# Patient Record
Sex: Female | Born: 1980 | Race: Black or African American | Hispanic: No | Marital: Single | Smoking: Former smoker
Health system: Southern US, Community
[De-identification: ages and names within clinical notes are randomized; demographics above are authoritative.]

## PROBLEM LIST (undated history)

## (undated) ENCOUNTER — Inpatient Hospital Stay (HOSPITAL_COMMUNITY): Payer: Self-pay

## (undated) DIAGNOSIS — T7840XA Allergy, unspecified, initial encounter: Secondary | ICD-10-CM

## (undated) DIAGNOSIS — F329 Major depressive disorder, single episode, unspecified: Secondary | ICD-10-CM

## (undated) DIAGNOSIS — Z8619 Personal history of other infectious and parasitic diseases: Secondary | ICD-10-CM

## (undated) DIAGNOSIS — Z87891 Personal history of nicotine dependence: Secondary | ICD-10-CM

## (undated) DIAGNOSIS — R519 Headache, unspecified: Secondary | ICD-10-CM

## (undated) DIAGNOSIS — F32A Depression, unspecified: Secondary | ICD-10-CM

## (undated) DIAGNOSIS — G4730 Sleep apnea, unspecified: Secondary | ICD-10-CM

## (undated) DIAGNOSIS — R51 Headache: Secondary | ICD-10-CM

## (undated) DIAGNOSIS — G43909 Migraine, unspecified, not intractable, without status migrainosus: Secondary | ICD-10-CM

## (undated) DIAGNOSIS — K219 Gastro-esophageal reflux disease without esophagitis: Secondary | ICD-10-CM

## (undated) DIAGNOSIS — Z9889 Other specified postprocedural states: Secondary | ICD-10-CM

## (undated) DIAGNOSIS — J939 Pneumothorax, unspecified: Secondary | ICD-10-CM

## (undated) DIAGNOSIS — F419 Anxiety disorder, unspecified: Secondary | ICD-10-CM

## (undated) HISTORY — DX: Migraine, unspecified, not intractable, without status migrainosus: G43.909

## (undated) HISTORY — PX: OTHER SURGICAL HISTORY: SHX169

## (undated) HISTORY — DX: Personal history of nicotine dependence: Z87.891

## (undated) HISTORY — PX: PLEURAL SCARIFICATION: SHX748

## (undated) HISTORY — DX: Headache, unspecified: R51.9

## (undated) HISTORY — DX: Headache: R51

## (undated) HISTORY — DX: Personal history of other infectious and parasitic diseases: Z86.19

## (undated) HISTORY — DX: Gastro-esophageal reflux disease without esophagitis: K21.9

## (undated) HISTORY — DX: Depression, unspecified: F32.A

## (undated) HISTORY — DX: Major depressive disorder, single episode, unspecified: F32.9

## (undated) HISTORY — DX: Allergy, unspecified, initial encounter: T78.40XA

## (undated) HISTORY — PX: COLPOSCOPY VULVA W/ BIOPSY: SUR282

## (undated) HISTORY — DX: Pneumothorax, unspecified: J93.9

## (undated) HISTORY — DX: Other specified postprocedural states: Z98.890

---
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
## 2002-01-31 ENCOUNTER — Emergency Department (HOSPITAL_COMMUNITY): Admission: EM | Admit: 2002-01-31 | Discharge: 2002-01-31 | Payer: Self-pay | Admitting: Emergency Medicine

## 2002-01-31 ENCOUNTER — Encounter: Payer: Self-pay | Admitting: Emergency Medicine

## 2003-06-20 ENCOUNTER — Emergency Department (HOSPITAL_COMMUNITY): Admission: AD | Admit: 2003-06-20 | Discharge: 2003-06-20 | Payer: Self-pay | Admitting: Family Medicine

## 2003-06-23 HISTORY — PX: BREAST BIOPSY: SHX20

## 2003-08-14 ENCOUNTER — Ambulatory Visit (HOSPITAL_COMMUNITY): Admission: RE | Admit: 2003-08-14 | Discharge: 2003-08-14 | Payer: Self-pay | Admitting: General Surgery

## 2003-08-14 ENCOUNTER — Encounter (INDEPENDENT_AMBULATORY_CARE_PROVIDER_SITE_OTHER): Payer: Self-pay | Admitting: *Deleted

## 2003-08-14 ENCOUNTER — Ambulatory Visit (HOSPITAL_BASED_OUTPATIENT_CLINIC_OR_DEPARTMENT_OTHER): Admission: RE | Admit: 2003-08-14 | Discharge: 2003-08-14 | Payer: Self-pay | Admitting: General Surgery

## 2003-10-16 ENCOUNTER — Emergency Department (HOSPITAL_COMMUNITY): Admission: EM | Admit: 2003-10-16 | Discharge: 2003-10-16 | Payer: Self-pay | Admitting: Family Medicine

## 2004-05-06 ENCOUNTER — Emergency Department (HOSPITAL_COMMUNITY): Admission: EM | Admit: 2004-05-06 | Discharge: 2004-05-06 | Payer: Self-pay | Admitting: Emergency Medicine

## 2005-03-20 ENCOUNTER — Emergency Department (HOSPITAL_COMMUNITY): Admission: EM | Admit: 2005-03-20 | Discharge: 2005-03-20 | Payer: Self-pay | Admitting: Emergency Medicine

## 2006-07-11 ENCOUNTER — Emergency Department (HOSPITAL_COMMUNITY): Admission: EM | Admit: 2006-07-11 | Discharge: 2006-07-11 | Payer: Self-pay | Admitting: Emergency Medicine

## 2006-07-19 ENCOUNTER — Other Ambulatory Visit: Admission: RE | Admit: 2006-07-19 | Discharge: 2006-07-19 | Payer: Self-pay | Admitting: Obstetrics and Gynecology

## 2006-11-02 ENCOUNTER — Other Ambulatory Visit: Admission: RE | Admit: 2006-11-02 | Discharge: 2006-11-02 | Payer: Self-pay | Admitting: Obstetrics and Gynecology

## 2007-02-01 ENCOUNTER — Other Ambulatory Visit: Admission: RE | Admit: 2007-02-01 | Discharge: 2007-02-01 | Payer: Self-pay | Admitting: Obstetrics and Gynecology

## 2007-03-15 ENCOUNTER — Emergency Department (HOSPITAL_COMMUNITY): Admission: EM | Admit: 2007-03-15 | Discharge: 2007-03-15 | Payer: Self-pay | Admitting: Emergency Medicine

## 2007-05-03 ENCOUNTER — Other Ambulatory Visit: Admission: RE | Admit: 2007-05-03 | Discharge: 2007-05-03 | Payer: Self-pay | Admitting: Obstetrics and Gynecology

## 2007-05-29 ENCOUNTER — Emergency Department (HOSPITAL_COMMUNITY): Admission: EM | Admit: 2007-05-29 | Discharge: 2007-05-29 | Payer: Self-pay | Admitting: Emergency Medicine

## 2007-06-08 ENCOUNTER — Emergency Department (HOSPITAL_COMMUNITY): Admission: EM | Admit: 2007-06-08 | Discharge: 2007-06-08 | Payer: Self-pay | Admitting: Emergency Medicine

## 2007-09-18 ENCOUNTER — Emergency Department (HOSPITAL_COMMUNITY): Admission: EM | Admit: 2007-09-18 | Discharge: 2007-09-18 | Payer: Self-pay | Admitting: Emergency Medicine

## 2008-01-21 ENCOUNTER — Inpatient Hospital Stay (HOSPITAL_COMMUNITY): Admission: AD | Admit: 2008-01-21 | Discharge: 2008-01-21 | Payer: Self-pay | Admitting: Obstetrics and Gynecology

## 2008-02-09 ENCOUNTER — Inpatient Hospital Stay (HOSPITAL_COMMUNITY): Admission: AD | Admit: 2008-02-09 | Discharge: 2008-02-09 | Payer: Self-pay | Admitting: Obstetrics and Gynecology

## 2008-03-27 ENCOUNTER — Inpatient Hospital Stay (HOSPITAL_COMMUNITY): Admission: AD | Admit: 2008-03-27 | Discharge: 2008-03-28 | Payer: Self-pay | Admitting: Obstetrics & Gynecology

## 2008-04-04 ENCOUNTER — Inpatient Hospital Stay (HOSPITAL_COMMUNITY): Admission: AD | Admit: 2008-04-04 | Discharge: 2008-04-05 | Payer: Self-pay | Admitting: Obstetrics and Gynecology

## 2008-04-21 ENCOUNTER — Inpatient Hospital Stay (HOSPITAL_COMMUNITY): Admission: AD | Admit: 2008-04-21 | Discharge: 2008-04-21 | Payer: Self-pay | Admitting: Obstetrics and Gynecology

## 2008-05-11 ENCOUNTER — Inpatient Hospital Stay (HOSPITAL_COMMUNITY): Admission: AD | Admit: 2008-05-11 | Discharge: 2008-05-14 | Payer: Self-pay | Admitting: Obstetrics & Gynecology

## 2008-08-08 ENCOUNTER — Emergency Department (HOSPITAL_COMMUNITY): Admission: EM | Admit: 2008-08-08 | Discharge: 2008-08-08 | Payer: Self-pay | Admitting: Family Medicine

## 2010-08-06 ENCOUNTER — Other Ambulatory Visit (HOSPITAL_COMMUNITY): Payer: Self-pay | Admitting: Family Medicine

## 2010-08-06 DIAGNOSIS — N8300 Follicular cyst of ovary, unspecified side: Secondary | ICD-10-CM

## 2010-08-14 ENCOUNTER — Ambulatory Visit (HOSPITAL_COMMUNITY)
Admission: RE | Admit: 2010-08-14 | Discharge: 2010-08-14 | Disposition: A | Discharge status: Home / Self Care | Payer: Medicaid Other | Source: Ambulatory Visit | Attending: Family Medicine | Admitting: Family Medicine

## 2010-08-14 DIAGNOSIS — N8300 Follicular cyst of ovary, unspecified side: Secondary | ICD-10-CM | POA: Insufficient documentation

## 2010-11-07 NOTE — Op Note (Signed)
NAME:  OLAR, SANTINI                         ACCOUNT NO.:  [id number]   MEDICAL RECORD NO.:  [id number]                   PATIENT TYPE:  AMB   LOCATION:  DSC                                  FACILITY:  [facility]   PHYSICIAN:  Rose Phi. Maple Hudson, M.D.                DATE OF BIRTH:  Mar 20, 1981   DATE OF PROCEDURE:  08/14/2003  DATE OF DISCHARGE:                                 OPERATIVE REPORT   PREOPERATIVE DIAGNOSIS:  Fibroadenoma of the left breast.   POSTOPERATIVE DIAGNOSIS:  Fibroadenoma of the left breast.   OPERATION PERFORMED:  Excision of fibroadenoma of the left breast.   SURGEON:  Rose Phi. Maple Hudson, M.D.   ANESTHESIA:  MAC.   DESCRIPTION OF PROCEDURE:  The patient was placed on the operating table  with the arms extended on the arm board.  The left breast was prepped and  draped in the usual fashion.  The palpable 2 cm fibroadenoma was in the  upper outer quadrant.  I made a small drawing over the palpable mass to  outline the incision.  The area was then infiltrated with a mixture of 1%  Xylocaine and 0.25% Marcaine.  Incision was made and the fibroadenoma  exposed.  I put a suture in it to lift up for traction purposes and then  simply excised it.  Hemostasis was obtained with the cautery.  Subcuticular  closure with 4-0 Monocryl and Steri-Strips carried out.  Dressing applied.  The patient was then transferred to the recovery room in satisfactory  condition having tolerated the procedure well.                                               Rose Phi. Maple Hudson, M.D.    PRY/MEDQ  D:  08/14/2003  T:  08/14/2003  Job:  [id number]

## 2011-01-20 ENCOUNTER — Emergency Department (HOSPITAL_COMMUNITY): Payer: Medicaid Other

## 2011-01-20 ENCOUNTER — Inpatient Hospital Stay (HOSPITAL_COMMUNITY)
Admission: EM | Admit: 2011-01-20 | Discharge: 2011-01-25 | DRG: 201 | Disposition: A | Discharge status: Home / Self Care | Payer: Medicaid Other | Attending: Thoracic Surgery | Admitting: Thoracic Surgery

## 2011-01-20 DIAGNOSIS — R079 Chest pain, unspecified: Secondary | ICD-10-CM

## 2011-01-20 DIAGNOSIS — R0602 Shortness of breath: Secondary | ICD-10-CM

## 2011-01-20 DIAGNOSIS — J930 Spontaneous tension pneumothorax: Secondary | ICD-10-CM

## 2011-01-20 DIAGNOSIS — Z87891 Personal history of nicotine dependence: Secondary | ICD-10-CM

## 2011-01-20 DIAGNOSIS — J9383 Other pneumothorax: Principal | ICD-10-CM | POA: Diagnosis present

## 2011-01-20 HISTORY — PX: OTHER SURGICAL HISTORY: SHX169

## 2011-01-21 ENCOUNTER — Inpatient Hospital Stay (HOSPITAL_COMMUNITY): Payer: Medicaid Other

## 2011-01-21 ENCOUNTER — Emergency Department (HOSPITAL_COMMUNITY): Payer: Medicaid Other

## 2011-01-21 DIAGNOSIS — J930 Spontaneous tension pneumothorax: Secondary | ICD-10-CM

## 2011-01-21 LAB — COMPREHENSIVE METABOLIC PANEL
ALT: 10 U/L (ref 0–35)
Alkaline Phosphatase: 37 U/L — ABNORMAL LOW (ref 39–117)
CO2: 24 mEq/L (ref 19–32)
Calcium: 7.9 mg/dL — ABNORMAL LOW (ref 8.4–10.5)
Chloride: 113 mEq/L — ABNORMAL HIGH (ref 96–112)
GFR calc Af Amer: >60 mL/min (ref >60)
GFR calc non Af Amer: >60 mL/min (ref >60)
Glucose, Bld: 100 mg/dL — ABNORMAL HIGH (ref 70–99)
Potassium: 4 mEq/L (ref 3.5–5.1)
Sodium: 140 mEq/L (ref 135–145)
Total Bilirubin: 0.2 mg/dL — ABNORMAL LOW (ref 0.3–1.2)

## 2011-01-21 LAB — PROTIME-INR
INR: 1.05 (ref 0.00–1.49)
Prothrombin Time: 13.9 seconds (ref 11.6–15.2)

## 2011-01-21 LAB — DIFFERENTIAL
Basophils Absolute: 0.1 10*3/uL (ref 0.0–0.1)
Basophils Relative: 1 % (ref 0–1)
Neutro Abs: 2.6 10*3/uL (ref 1.7–7.7)
Neutrophils Relative %: 46 % (ref 43–77)

## 2011-01-21 LAB — URINALYSIS, ROUTINE W REFLEX MICROSCOPIC
Hgb urine dipstick: NEGATIVE
Ketones, ur: NEGATIVE mg/dL
Protein, ur: NEGATIVE mg/dL
Urobilinogen, UA: 1 mg/dL (ref 0.0–1.0)

## 2011-01-21 LAB — BASIC METABOLIC PANEL
CO2: 25 mEq/L (ref 19–32)
GFR calc non Af Amer: >60 mL/min (ref >60)
Glucose, Bld: 70 mg/dL (ref 70–99)
Potassium: 3.6 mEq/L (ref 3.5–5.1)
Sodium: 138 mEq/L (ref 135–145)

## 2011-01-21 LAB — CBC
Hemoglobin: 12.6 g/dL (ref 12.0–15.0)
RBC: 3.86 MIL/uL — ABNORMAL LOW (ref 3.87–5.11)

## 2011-01-21 LAB — URINE MICROSCOPIC-ADD ON

## 2011-01-22 ENCOUNTER — Inpatient Hospital Stay (HOSPITAL_COMMUNITY): Payer: Medicaid Other

## 2011-01-23 ENCOUNTER — Inpatient Hospital Stay (HOSPITAL_COMMUNITY): Payer: Medicaid Other

## 2011-01-24 ENCOUNTER — Inpatient Hospital Stay (HOSPITAL_COMMUNITY): Payer: Medicaid Other

## 2011-01-25 ENCOUNTER — Inpatient Hospital Stay (HOSPITAL_COMMUNITY): Payer: Medicaid Other

## 2011-01-27 NOTE — H&P (Signed)
  NAMEMarland Kitchen  [name], [name] NO.:  [id number]  MEDICAL RECORD NO.:  [id number]  LOCATION:  3312                         FACILITY:  [facility]  PHYSICIAN:  Ines Bloomer, M.D. DATE OF BIRTH:  05-18-1981  DATE OF ADMISSION:  01/20/2011 DATE OF DISCHARGE:                             HISTORY & PHYSICAL   CHIEF COMPLAINTS:  Left chest pain, shortness of breath.  HISTORY OF PRESENT ILLNESS:  This 30 year old African American female presented to the emergency room with shortness of breath and left chest pain times 2 to 3 hours.  A chest x-ray was obtained which showed a 40% left spontaneous pneumothorax.  We were consulted and inserted a #20 left chest tube without difficulty.  PAST MEDICAL HISTORY:  None.  ALLERGIES:  The PENICILLIN causes to have itching.  MEDICATIONS:  No current medications.  PAST SURGICAL HISTORY:  She had a previous left wrist biopsy and a cervical biopsy.  FAMILY HISTORY:  Noncontributory.  SOCIAL HISTORY:  He is single, does not drink, used to smoke, quit smoking in June 2012.  REVIEW OF SYSTEMS:  GENERAL:  Her weight has been stable.  CARDIAC:  No angina or atrial fibrillation.  PULMONARY:  See history of present illness.  No hemoptysis.  GI:  No nausea, vomiting, constipation, or diarrhea.  GU:  No kidney disease, dysuria, or frequent urination. VASCULAR:  No claudication, DVT, or TIAs.  NEUROLOGICAL:  No dizziness, headaches, blackouts, or seizures.  MUSCULOSKELETAL:  No arthritis or muscular pain or rash.  PSYCHIATRIC:  No depression or nervousness. ENT:  No changes in her eyesight or hearing.  HEMATOLOGICAL:  No problems with bleeding, clotting disorders, or anemia.  PHYSICAL EXAMINATION:  VITAL SIGNS:  Her blood pressure is 113/70, pulse 81, respirations 18, and sats were 100% on oxygen. HEENT:  Head is atraumatic.  Eyes:  Pupils are equal and reactive to light and accommodation.  Extraocular movements are normal.  Ears: Tympanic  membranes are intact.  Nose: There is no septal deviation. Throat is without lesions. NECK:  Supple without thyromegaly.  There is no JV distention.  There is no supraclavicular or axillary adenopathy. CHEST:  Clear to auscultation and percussion.  Left breast, there is upper quadrant biopsy. HEART:  Regular sinus rhythm.  No murmurs. ABDOMEN:  Soft.  There is no hepatosplenomegaly. EXTREMITIES:  Pulses are 2+.  There is no clubbing or edema. NEUROLOGICAL:  She is oriented x3.  Sensory and motor intact.  Cranial nerves intact.  IMPRESSION: 1. Left spontaneous pneumothorax. 2. History of left breast biopsy. 3. History of cervical biopsy.  PLAN:  Insertion of the left chest tube which is done.     Ines Bloomer, M.D.     DPB/MEDQ  D:  01/21/2011  T:  01/21/2011  Job:  [id number]  Electronically Signed by Jovita Gamma M.D. on 01/27/2011 02:59:18 PM

## 2011-01-27 NOTE — Op Note (Signed)
  NAMEMarland Kitchen  URIJAH, RAYNOR NO.:  [id number]  MEDICAL RECORD NO.:  [id number]  LOCATION:  3312                         FACILITY:  [facility]  PHYSICIAN:  Ines Bloomer, M.D. DATE OF BIRTH:  [birth year]  DATE OF PROCEDURE:  01/20/2011 DATE OF DISCHARGE:                              OPERATIVE REPORT   PREOPERATIVE DIAGNOSIS:  Left spontaneous pneumothorax.  POSTOPERATIVE DIAGNOSIS:  Left spontaneous pneumothorax.  OPERATION PERFORMED:  Insertion of left chest tube.  SURGEON:  Ines Bloomer, MD.  ANESTHESIA:  5 mg of Versed and 1% Xylocaine.  After prep and drape in the left chest, area was infiltrated with 1% Xylocaine at the midaxillary line at the fifth intercostal rib space.  A 1-cm incision was made and 2 #0 silks were inserted.  We then dissected down through the intercostal space and inserted a #20 chest tube without difficulty.  It was sutured in place with 0 silk and then a dry sterile dressing applied, connected to a Pleur-Evac and 20 cm of suction.  The patient tolerated the procedure well.     Ines Bloomer, M.D.     DPB/MEDQ  D:  01/21/2011  T:  01/21/2011  Job:  [id number]  Electronically Signed by Jovita Gamma M.D. on 01/27/2011 02:59:20 PM

## 2011-02-02 ENCOUNTER — Other Ambulatory Visit: Payer: Self-pay | Admitting: Thoracic Surgery

## 2011-02-02 DIAGNOSIS — J930 Spontaneous tension pneumothorax: Secondary | ICD-10-CM

## 2011-02-03 ENCOUNTER — Ambulatory Visit
Admission: RE | Admit: 2011-02-03 | Discharge: 2011-02-03 | Disposition: A | Discharge status: Home / Self Care | Payer: Medicaid Other | Source: Ambulatory Visit | Attending: Thoracic Surgery | Admitting: Thoracic Surgery

## 2011-02-03 ENCOUNTER — Ambulatory Visit (INDEPENDENT_AMBULATORY_CARE_PROVIDER_SITE_OTHER): Payer: Self-pay | Admitting: Thoracic Surgery

## 2011-02-03 DIAGNOSIS — J9311 Primary spontaneous pneumothorax: Secondary | ICD-10-CM

## 2011-02-03 DIAGNOSIS — J930 Spontaneous tension pneumothorax: Secondary | ICD-10-CM

## 2011-02-04 NOTE — Assessment & Plan Note (Signed)
OFFICE VISIT  STARLEE, CORRALEJO DOB:  1980-12-20                                        February 03, 2011 CHART #:  [id number]  The patient came today.  Her blood pressure was 106/66, pulse 74, respirations 16, sats 100%.  Incision looks good.  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  I have released her to full activity.  No recurrence of her pneumothorax.  I will release to full activity.  We will see her back again in 4 weeks with a chest x-ray for a final check.  Ines Bloomer, M.D. Electronically Signed  DPB/MEDQ  D:  02/03/2011  T:  02/04/2011  Job:  [id number]

## 2011-02-16 NOTE — Discharge Summary (Signed)
  NAMEMarland Kitchen  CHONTEL, WARNING NO.:  [id number]  MEDICAL RECORD NO.:  [id number]  LOCATION:  2037                         FACILITY:  [facility]  PHYSICIAN:  Ines Bloomer, M.D. DATE OF BIRTH:  1980/11/21  DATE OF ADMISSION:  01/20/2011 DATE OF DISCHARGE:                              DISCHARGE SUMMARY   PRIMARY ADMITTING DIAGNOSES: 1. Shortness of breath. 2. Chest pain.  ADDITIONAL/DISCHARGE DIAGNOSES: 1. Spontaneous left pneumothorax. 2. History of tobacco abuse. 3. History of left breast biopsy. 4. History of cervical biopsy.  PROCEDURES PERFORMED:  Insertion of left chest tube.  HISTORY:  The patient is a 30 year old female who presented to the emergency department on the date of this admission complaining of left chest discomfort with associated shortness of breath which had been present for 2-3 hours.  A chest x-ray was obtained which showed a 40% left spontaneous pneumothorax.  Dr. Edwyna Shell was then consulted and reviewed her films and agreed with the need for placement of a chest tube.  This was performed while the patient was in the emergency department and was connected to Pleur-Evac suction.  The patient was subsequently admitted to the Step-Down Unit for further management.  HOSPITAL COURSE:  The chest tube was initially connected to suction, and there was minimal residual pneumothorax on the left.  Her suction has slowly been weaned and discontinued.  Her chest x-rays have remained stable with a small left apical pneumothorax which has been unchanged. Her condition is otherwise remained stable during this admission.  Her labs show a sodium of 140, potassium 4.0, BUN 13, and creatinine 0.83. White count 5.6, hemoglobin 12.6, hematocrit 36.8, and platelets 269. We will repeat a PA and lateral chest x-ray within the next 24 hours. If her pneumothorax has remained stable and no other acute changes have occurred, we anticipate discharge home within the next  24-48 hours.  DISCHARGE MEDICATIONS:  Percocet 5/325 one to two q.6 h p.r.n. pain.  DISCHARGE INSTRUCTIONS:  She is asked to refrain from driving, heavy lifting, or strenuous activity.  She may continue ambulating daily and using her incentive spirometer.  She may shower daily and clean her incisions with soap and water.  She will continue her same preoperative diet.  DISCHARGE FOLLOWUP:  She will return to the office to see Dr. Edwyna Shell in 1 week with a chest x-ray.  In the interim, if she experiences any problems or has questions she is asked to contact our office immediately.     Coral Ceo, P.A.   ______________________________ Ines Bloomer, M.D.    GC/MEDQ  D:  01/23/2011  T:  01/23/2011  Job:  [id number]  Electronically Signed by Coral Ceo P.A. on 02/05/2011 11:12:30 AM Electronically Signed by Jovita Gamma M.D. on 02/16/2011 03:29:01 PM

## 2011-03-04 ENCOUNTER — Other Ambulatory Visit: Payer: Self-pay | Admitting: Thoracic Surgery

## 2011-03-04 ENCOUNTER — Ambulatory Visit
Admission: RE | Admit: 2011-03-04 | Discharge: 2011-03-04 | Disposition: A | Discharge status: Home / Self Care | Payer: Medicaid Other | Source: Ambulatory Visit | Attending: Thoracic Surgery | Admitting: Thoracic Surgery

## 2011-03-04 ENCOUNTER — Ambulatory Visit (INDEPENDENT_AMBULATORY_CARE_PROVIDER_SITE_OTHER): Payer: Self-pay | Admitting: Thoracic Surgery

## 2011-03-04 ENCOUNTER — Encounter: Payer: Self-pay | Admitting: Thoracic Surgery

## 2011-03-04 VITALS — BP 111/69 | HR 76 | Resp 16 | Ht 69.0 in | Wt 120.0 lb

## 2011-03-04 DIAGNOSIS — J930 Spontaneous tension pneumothorax: Secondary | ICD-10-CM

## 2011-03-04 DIAGNOSIS — J939 Pneumothorax, unspecified: Secondary | ICD-10-CM | POA: Insufficient documentation

## 2011-03-04 DIAGNOSIS — Z9889 Other specified postprocedural states: Secondary | ICD-10-CM | POA: Insufficient documentation

## 2011-03-04 DIAGNOSIS — Z87891 Personal history of nicotine dependence: Secondary | ICD-10-CM | POA: Insufficient documentation

## 2011-03-04 DIAGNOSIS — Z09 Encounter for follow-up examination after completed treatment for conditions other than malignant neoplasm: Secondary | ICD-10-CM

## 2011-03-04 DIAGNOSIS — J9383 Other pneumothorax: Secondary | ICD-10-CM

## 2011-03-04 NOTE — Progress Notes (Signed)
HPI patient returns for followup of her left pneumothorax. Chest x-ray showed no evidence of recurrence of her pneumothorax she is doing well overall I will see her again if he has any future problems.    No current outpatient prescriptions on file.     Review of Systems: No change   Physical Exam  Cardiovascular: Normal rate, regular rhythm, normal heart sounds and intact distal pulses.   Pulmonary/Chest: Effort normal and breath sounds normal.     Diagnostic Tests: Clear chest x-ray showed no evidence pneumothorax  Impression: Status post left pneumothorax   Plan: Return as needed

## 2011-03-16 LAB — URINE CULTURE: Colony Count: >=100000

## 2011-03-16 LAB — CBC
HCT: 33.5 — ABNORMAL LOW
Hemoglobin: 11.7 — ABNORMAL LOW
MCHC: 34.9
MCV: 98.8
Platelets: 222
RBC: 3.39 — ABNORMAL LOW
RDW: 13
WBC: 8

## 2011-03-16 LAB — COMPREHENSIVE METABOLIC PANEL
Alkaline Phosphatase: 37 — ABNORMAL LOW
BUN: 7
CO2: 26
Chloride: 105
Creatinine, Ser: 0.73
GFR calc non Af Amer: >60
Glucose, Bld: 96
Total Bilirubin: 0.7

## 2011-03-16 LAB — URINALYSIS, ROUTINE W REFLEX MICROSCOPIC
Bilirubin Urine: NEGATIVE
Glucose, UA: NEGATIVE
Ketones, ur: NEGATIVE
Leukocytes, UA: NEGATIVE
Nitrite: NEGATIVE
Protein, ur: NEGATIVE
Specific Gravity, Urine: 1.01
Urobilinogen, UA: 0.2
pH: 7.5

## 2011-03-16 LAB — DIFFERENTIAL
Basophils Absolute: 0
Basophils Relative: 0
Eosinophils Absolute: 0.1
Eosinophils Relative: 1
Lymphocytes Relative: 13
Lymphs Abs: 1
Monocytes Absolute: 0.7
Monocytes Relative: 8
Neutro Abs: 6.2
Neutrophils Relative %: 78 — ABNORMAL HIGH

## 2011-03-16 LAB — COMPREHENSIVE METABOLIC PANEL WITH GFR
ALT: 15
AST: 18
Albumin: 3.4 — ABNORMAL LOW
Calcium: 9.1
GFR calc Af Amer: >60
Potassium: 4.1
Sodium: 134 — ABNORMAL LOW
Total Protein: 6.5

## 2011-03-16 LAB — URINE MICROSCOPIC-ADD ON

## 2011-03-16 LAB — LIPASE, BLOOD: Lipase: 20

## 2011-03-16 LAB — D-DIMER, QUANTITATIVE: D-Dimer, Quant: 0.64 — ABNORMAL HIGH

## 2011-03-20 LAB — GC/CHLAMYDIA PROBE AMP, GENITAL
Chlamydia, DNA Probe: NEGATIVE
GC Probe Amp, Genital: NEGATIVE

## 2011-03-20 LAB — WET PREP, GENITAL

## 2011-03-24 LAB — URINALYSIS, ROUTINE W REFLEX MICROSCOPIC
Bilirubin Urine: NEGATIVE
Hgb urine dipstick: NEGATIVE
Nitrite: NEGATIVE
Specific Gravity, Urine: <1.005 — ABNORMAL LOW
Urobilinogen, UA: 0.2
pH: 6.5

## 2011-03-24 LAB — URINALYSIS, DIPSTICK ONLY
Hgb urine dipstick: NEGATIVE
Leukocytes, UA: NEGATIVE
Nitrite: NEGATIVE
Protein, ur: NEGATIVE
Specific Gravity, Urine: <1.005 — ABNORMAL LOW
Urobilinogen, UA: 0.2

## 2011-03-24 LAB — CBC
MCHC: 33.5
MCV: 85.1
Platelets: 234
Platelets: 282
RBC: 2.55 — ABNORMAL LOW
RBC: 2.9 — ABNORMAL LOW
WBC: 10.1

## 2011-03-24 LAB — URINE MICROSCOPIC-ADD ON: RBC / HPF: NONE SEEN

## 2011-03-24 LAB — RPR: RPR Ser Ql: NONREACTIVE

## 2011-04-18 ENCOUNTER — Emergency Department (HOSPITAL_COMMUNITY): Payer: Medicaid Other

## 2011-04-18 ENCOUNTER — Emergency Department (HOSPITAL_COMMUNITY)
Admission: EM | Admit: 2011-04-18 | Discharge: 2011-04-18 | Disposition: A | Discharge status: Home / Self Care | Payer: Medicaid Other | Attending: Emergency Medicine | Admitting: Emergency Medicine

## 2011-04-18 DIAGNOSIS — R079 Chest pain, unspecified: Secondary | ICD-10-CM | POA: Insufficient documentation

## 2011-04-18 DIAGNOSIS — R0602 Shortness of breath: Secondary | ICD-10-CM | POA: Insufficient documentation

## 2011-04-18 DIAGNOSIS — J3489 Other specified disorders of nose and nasal sinuses: Secondary | ICD-10-CM | POA: Insufficient documentation

## 2011-04-18 DIAGNOSIS — R059 Cough, unspecified: Secondary | ICD-10-CM | POA: Insufficient documentation

## 2011-04-18 DIAGNOSIS — R062 Wheezing: Secondary | ICD-10-CM | POA: Insufficient documentation

## 2011-04-18 DIAGNOSIS — J40 Bronchitis, not specified as acute or chronic: Secondary | ICD-10-CM | POA: Insufficient documentation

## 2011-04-18 DIAGNOSIS — R05 Cough: Secondary | ICD-10-CM | POA: Insufficient documentation

## 2011-04-18 DIAGNOSIS — R5381 Other malaise: Secondary | ICD-10-CM | POA: Insufficient documentation

## 2011-04-18 LAB — RAPID STREP SCREEN (MED CTR MEBANE ONLY): Streptococcus, Group A Screen (Direct): NEGATIVE

## 2011-06-12 ENCOUNTER — Encounter: Payer: Self-pay | Admitting: Obstetrics and Gynecology

## 2011-06-12 ENCOUNTER — Ambulatory Visit (INDEPENDENT_AMBULATORY_CARE_PROVIDER_SITE_OTHER): Payer: Medicaid Other | Admitting: Obstetrics and Gynecology

## 2011-06-12 VITALS — BP 96/59 | HR 70 | Temp 97.2°F | Ht 69.0 in | Wt 122.4 lb

## 2011-06-12 DIAGNOSIS — Z3046 Encounter for surveillance of implantable subdermal contraceptive: Secondary | ICD-10-CM

## 2011-06-12 NOTE — Progress Notes (Signed)
S: Pt presents today for Implanon removal. She states the [facility] was unsuccessful in their attempt to remove it and she was referred to [place] for removal. She has no complaints at this time. O: VSS A&O x 3 in NAD Left arm was cleaned and prepped in the normal sterile fashion with betadine. Small incision was then made and Implanon insert was easily removed. Pt tolerated procedure well. Wound was then closed with steri-strip. Dressing was then placed. A/P: Implanon removal: discussed wound care instructions and precautions. She will f/u with her PCP. Discussed diet, activity, risks, and precautions.  Clinton Gallant. Rice III, DrHSc, MPAS, PA-C

## 2011-07-27 ENCOUNTER — Ambulatory Visit (INDEPENDENT_AMBULATORY_CARE_PROVIDER_SITE_OTHER): Payer: Medicaid Other | Admitting: Family Medicine

## 2011-07-27 ENCOUNTER — Other Ambulatory Visit (HOSPITAL_COMMUNITY)
Admission: RE | Admit: 2011-07-27 | Discharge: 2011-07-27 | Disposition: A | Discharge status: Home / Self Care | Payer: Medicaid Other | Source: Ambulatory Visit | Attending: Family Medicine | Admitting: Family Medicine

## 2011-07-27 ENCOUNTER — Encounter: Payer: Self-pay | Admitting: Family Medicine

## 2011-07-27 DIAGNOSIS — N879 Dysplasia of cervix uteri, unspecified: Secondary | ICD-10-CM | POA: Insufficient documentation

## 2011-07-27 DIAGNOSIS — IMO0002 Reserved for concepts with insufficient information to code with codable children: Secondary | ICD-10-CM

## 2011-07-27 DIAGNOSIS — R87612 Low grade squamous intraepithelial lesion on cytologic smear of cervix (LGSIL): Secondary | ICD-10-CM

## 2011-07-27 DIAGNOSIS — Z9889 Other specified postprocedural states: Secondary | ICD-10-CM

## 2011-07-27 LAB — POCT PREGNANCY, URINE: Preg Test, Ur: NEGATIVE

## 2011-07-27 NOTE — Patient Instructions (Signed)
Colposcopy Care After Colposcopy is a procedure in which a special tool is used to magnify the surface of the cervix. A tissue sample (biopsy) may also be taken. This sample will be looked at for cervical cancer or other problems. After the test:  You may have some cramping.   Lie down for a few minutes if you feel lightheaded.    You may have some bleeding which should stop in a few days.  HOME CARE  Do not have sex or use tampons for 2 to 3 days or as told.   Only take medicine as told by your doctor.   Continue to take your birth control pills as usual.  Finding out the results of your test Ask when your test results will be ready. Make sure you get your test results. GET HELP RIGHT AWAY IF:  You are bleeding a lot or are passing blood clots.   You develop a fever of 102 F (38.9 C) or higher.   You have abnormal vaginal discharge.   You have cramps that do not go away with medicine.   You feel lightheaded, dizzy, or pass out (faint).  MAKE SURE YOU:   Understand these instructions.   Will watch your condition.   Will get help right away if you are not doing well or get worse.  Document Released: 11/25/2007 Document Revised: 02/18/2011 Document Reviewed: 11/25/2007 [employer] Patient Information 2012 [employer]. 

## 2011-07-27 NOTE — Progress Notes (Signed)
Patient given informed consent, signed copy in the chart, time out was performed.  Placed in lithotomy position. Cervix viewed with speculum and colposcope after application of acetic acid.   Colposcopy adequate?  yes Acetowhite lesions?1 o'clock Punctation?no Mosaicism?  1 o'clock Abnormal vasculature?  6 o'clock Biopsies?1 and 6 o;clock ECC?done  Patient was given post procedure instructions.  She will return in 4 weeks for results.

## 2011-08-16 ENCOUNTER — Emergency Department (HOSPITAL_COMMUNITY): Payer: Medicaid Other

## 2011-08-16 ENCOUNTER — Encounter (HOSPITAL_COMMUNITY): Payer: Self-pay | Admitting: *Deleted

## 2011-08-16 ENCOUNTER — Emergency Department (HOSPITAL_COMMUNITY)
Admission: EM | Admit: 2011-08-16 | Discharge: 2011-08-16 | Disposition: A | Discharge status: Home / Self Care | Payer: Medicaid Other | Attending: Emergency Medicine | Admitting: Emergency Medicine

## 2011-08-16 DIAGNOSIS — R079 Chest pain, unspecified: Secondary | ICD-10-CM | POA: Insufficient documentation

## 2011-08-16 DIAGNOSIS — J9383 Other pneumothorax: Secondary | ICD-10-CM | POA: Insufficient documentation

## 2011-08-16 DIAGNOSIS — J939 Pneumothorax, unspecified: Secondary | ICD-10-CM

## 2011-08-16 DIAGNOSIS — R0602 Shortness of breath: Secondary | ICD-10-CM | POA: Insufficient documentation

## 2011-08-16 DIAGNOSIS — M549 Dorsalgia, unspecified: Secondary | ICD-10-CM | POA: Insufficient documentation

## 2011-08-16 LAB — URINE MICROSCOPIC-ADD ON

## 2011-08-16 LAB — TYPE AND SCREEN
ABO/RH(D): A POS
Antibody Screen: NEGATIVE

## 2011-08-16 LAB — BASIC METABOLIC PANEL
BUN: 13 mg/dL (ref 6–23)
Chloride: 101 mEq/L (ref 96–112)
Creatinine, Ser: 0.78 mg/dL (ref 0.50–1.10)
GFR calc Af Amer: >90 mL/min (ref >90)
Glucose, Bld: 93 mg/dL (ref 70–99)
Potassium: 4 mEq/L (ref 3.5–5.1)

## 2011-08-16 LAB — URINALYSIS, ROUTINE W REFLEX MICROSCOPIC
Bilirubin Urine: NEGATIVE
Glucose, UA: NEGATIVE mg/dL
Hgb urine dipstick: NEGATIVE
Specific Gravity, Urine: 1.017 (ref 1.005–1.030)
pH: 6.5 (ref 5.0–8.0)

## 2011-08-16 LAB — CBC
HCT: 38 % (ref 36.0–46.0)
Hemoglobin: 13.2 g/dL (ref 12.0–15.0)
MCH: 33.1 pg (ref 26.0–34.0)
MCHC: 34.7 g/dL (ref 30.0–36.0)
RBC: 3.99 MIL/uL (ref 3.87–5.11)

## 2011-08-16 LAB — DIFFERENTIAL
Basophils Relative: 0 % (ref 0–1)
Lymphs Abs: 2 10*3/uL (ref 0.7–4.0)
Monocytes Absolute: 0.5 10*3/uL (ref 0.1–1.0)
Monocytes Relative: 7 % (ref 3–12)
Neutro Abs: 4.6 10*3/uL (ref 1.7–7.7)
Neutrophils Relative %: 64 % (ref 43–77)

## 2011-08-16 LAB — ABO/RH: ABO/RH(D): A POS

## 2011-08-16 MED ORDER — HYDROCODONE-ACETAMINOPHEN 7.5-500 MG/15ML PO SOLN
ORAL | Status: AC
  Filled 2011-08-16: qty 45

## 2011-08-16 MED ORDER — HYDROCODONE-ACETAMINOPHEN 7.5-500 MG/15ML PO SOLN
15.0000 mL | Freq: Once | ORAL | Status: AC
  Administered 2011-08-16: 15 mL via ORAL

## 2011-08-16 MED ORDER — HYDROCODONE-ACETAMINOPHEN 7.5-500 MG/15ML PO SOLN: 15.0000 mL | Freq: Four times a day (QID) | ORAL | Status: DC | PRN

## 2011-08-16 NOTE — ED Provider Notes (Signed)
Medical screening examination/treatment/procedure(s) were conducted as a shared visit with non-physician practitioner(s) and myself.  I personally evaluated the patient during the encounter   Nat Christen, MD 08/16/11 [phone]

## 2011-08-16 NOTE — ED Notes (Signed)
PA at bedside.

## 2011-08-16 NOTE — ED Provider Notes (Signed)
Medical screening examination/treatment/procedure(s) were conducted as a shared visit with non-physician practitioner(s) and myself.  I personally evaluated the patient during the encounter  He should presents with left upper chest and back pain that began yesterday with some inspiration.  He continued today while the patient was at church and she decided to come in for evaluation because of similar to when she had a spontaneous pneumothorax last July.  She was seen by Dr. Edwyna Shell from cardiothoracic surgery at that time.  Patient is not in any respiratory distress at this time and has no shortness of breath.  Her oxygen levels are 100%.  Patient has been placed on some supplemental oxygen to help with resorption of the spontaneous pneumothorax that is visualized on her chest x-ray today of approximately 15% per radiology.  I will contact Dr. Tyrone Sage who is on call for cardiothoracic surgery today.  2:41 PM I just spoke with Dr. Tyrone Sage who has stated that he would like the patient to be held in the emergency department so he can evaluate the patient as she may need admission for potentially surgery because this is a recurrence of a left-sided pneumothorax that she had previously.  Time will plan for a repeat chest x-ray in 6 hours and maintain the patient on supplemental oxygen.  Nat Christen, MD 08/16/11 1459

## 2011-08-16 NOTE — H&P (Signed)
[name] is an 31 y.o. female. 02/20/1981 ZOX:[id number]   Chief Complaint: Posterior left chest pain  HPI: This is a 31 year African American female with a history of left spontaneous pneumothorax requiring a left chest tube) July 2012.She was in her normal state of health until late last evening when she began experiencing sharp, left pleuritic chest pain. This was similar to the pain she had with her previous left spontaneous pneumothorax. She took a Tums as she thought she might have heartburn, but that did not relieve her symptoms.She denied any shortness of breath, fever, chills, hemoptysis, or cough. She presented to [facility] Emergency Room for further evaluation and treatment.Her vital signs are blood pressure is116/62, heart rate 71, O2 Sat 100% on 2 L via [place] and she is no acute distress.A thoracic evaluation was requested.  Past Medical History:  Diagnosis Date  . Pneumothorax on left July 2012   . History of tobacco abuse     Past Surgical History  Procedure Date  . Insertion of left chest tube 01/20/11    Dr Donata Clay  . Biopsy of cervix   . Breast biopsy   . Colposcopy vulva w/ biopsy    Family History:Mother is alive at age 31. No health problems.Father is alive at 52 and has GERD.  Social History:  She reports that she quit smoking about 8 months ago. Her smoking use included Cigarettes. She has never used smokeless tobacco. She reports that she drinks alcohol socially. She reports that she does not use illicit drugs.  Allergies:  Allergies  Allergen Reactions  . Penicillins Rash     Medications Prior to Admission:  Medication Sig Dispense Refill  . Multiple Vitamin (MULTIVITAMIN) tablet Take 1 tablet by mouth daily.          Results for orders placed during the hospital encounter of 08/16/11 (from the past 48 hour(s))  ABO/RH     Status: Normal   Collection Time   08/16/11  3:00 PM      Component Value Range Comment   ABO/RH(D) A POS     CBC     Status:  Normal   Collection Time   08/16/11  3:05 PM      Component Value Range Comment   WBC 7.2  4.0 - 10.5 (K/uL)    RBC 3.99  3.87 - 5.11 (MIL/uL)    Hemoglobin 13.2  12.0 - 15.0 (g/dL)    HCT 81.1  91.4 - 78.2 (%)    MCV 95.2  78.0 - 100.0 (fL)    MCH 33.1  26.0 - 34.0 (pg)    MCHC 34.7  30.0 - 36.0 (g/dL)    RDW 95.6  21.3 - 08.6 (%)    Platelets 281  150 - 400 (K/uL)   DIFFERENTIAL     Status: Normal   Collection Time   08/16/11  3:05 PM      Component Value Range Comment   Neutrophils Relative 64  43 - 77 (%)    Neutro Abs 4.6  1.7 - 7.7 (K/uL)    Lymphocytes Relative 27  12 - 46 (%)    Lymphs Abs 2.0  0.7 - 4.0 (K/uL)    Monocytes Relative 7  3 - 12 (%)    Monocytes Absolute 0.5  0.1 - 1.0 (K/uL)    Eosinophils Relative 1  0 - 5 (%)    Eosinophils Absolute 0.1  0.0 - 0.7 (K/uL)    Basophils Relative 0  0 - 1 (%)    Basophils Absolute 0.0  0.0 - 0.1 (K/uL)   BASIC METABOLIC PANEL     Status: Abnormal   Collection Time   08/16/11  3:05 PM      Component Value Range Comment   Sodium 133 (*) 135 - 145 (mEq/L)    Potassium 4.0  3.5 - 5.1 (mEq/L)    Chloride 101  96 - 112 (mEq/L)    CO2 25  19 - 32 (mEq/L)    Glucose, Bld 93  70 - 99 (mg/dL)    BUN 13  6 - 23 (mg/dL)    Creatinine, Ser 0.45  0.50 - 1.10 (mg/dL)    Calcium 9.9  8.4 - 10.5 (mg/dL)    GFR calc non Af Amer >90  >90 (mL/min)    GFR calc Af Amer >90  >90 (mL/min)   TYPE AND SCREEN     Status: Normal (Preliminary result)   Collection Time   08/16/11  3:05 PM      Component Value Range Comment   ABO/RH(D) A POS      Antibody Screen PENDING      Sample Expiration 08/19/2011      Dg Chest 2 View  08/16/2011  *RADIOLOGY REPORT*  Clinical Data: 31 year old female with chest pain and shortness of breath.  History of pneumothorax.  CHEST - 2 VIEW  Comparison: 04/18/2011  Findings: A 15% left apical pneumothorax is identified.  The cardiomediastinal silhouette is unremarkable. There is no evidence of airspace disease,  pleural effusion, pulmonary edema or acute bony abnormality.  IMPRESSION: 15% left apical pneumothorax.  Critical Value/emergent results were called by telephone at the time of interpretation on 08/16/2011  at 2:25 p.m.  to  Fayrene Helper, who verbally acknowledged these results.  Original Report Authenticated By: Rosendo Gros, M.D.   Review of Systems: General: normal appetite, normal energy  Respiratory: No shortness of breath, no wheezing, no hemoptysis, no pain with inspiration or cough,  Cardiac: No chest pain or tightness, no exertional SOB, no resting SOB,  no LE edema, no palpitations, no syncope  GI: no difficulty swallowing, no hematochezia, no hematemesis, no melena, no constipation, no diarrhea  GU: no dysuria, no urgency, no frequency  Musculoskeletal: no arthritis, no arthralgia  Neuro: no symptoms suggestive of TIA's, no seizures, no headaches, no peripheral neuropathy  Endocrine: Negative  HEENT: no loose teeth or painful teeth, no recent vision changes  Psych:  no depression    Vital Signs: Blood pressure 116/62, pulse 80, temperature 98.5 F (36.9 C), temperature source Oral, resp. rate 22, weight 125 lb (56.7 kg), last menstrual period 07/26/2011, SpO2 100.00%.  Physical Exam: HEENT:Head is atraumatic and normocephalic. Eyes: EOMI, PERRLA. Neck:supple, no JVD, no lymphadenopathy Cardiovascular:RRR, no murmurs, gallop, or rub. Pulmonary: Slightly decreased at left apex;otherwise, clear.No tachypnea or respiratory distress. Abdomen:Soft, non tender, bowel sounds present, no rebound or guarding. Extremities:No cyanosis, clubbing, or edema.Palpable DP and PT bilaterally. Musculoskeletal: Motor/sensory intact. No focal weakness. Neurologic:Cranial nerves II-XII grossly intact without any focal deficits.  Assessment/Plan: Recurrent left spontaneous pneumothorax. PA/LAT CXR shows a 15% left apical pneumothorax. Per discussion with Dr. Tyrone Sage, she does not require a chest  tube at this point. She will ultimately require a left VATS. We are going to observe her  in the ER over  the next several hours.Provided her vital signs and chest xray remain stable, she will be discharged home.The patient is agreeable to this as she needs to return  home to take care of her young son. We will arrange for a follow up in our office on Monday 08/17/2011.At that time, potential risks, complications, and benefits of the surgery will be discussed with the patient. Surgery will then be scheduled.Patient was instructed that if in the interim, she has increasing pain or shortness of breath she is  to come to the ER immediately.I discussed this plan with Dr. Golda Acre as well.  Ardelle Balls, PA-C 08/16/2011, 4:07 PM

## 2011-08-16 NOTE — ED Provider Notes (Signed)
The patient was seen and evaluated by the CT surgery physician assistant who discussed the case with her attending.  The recommendations were to followup in clinic tomorrow and monitor the patient in ER.  Patient's been in the ER for approximately 7 hours.  A repeat chest x-ray demonstrates an improving left pneumothorax.  Patient is asymptomatic.  Her vital signs are normal.  She does request some pain medicine to go home with.  She does understand to return to the ER for new or worsening  Jessica Co, MD 08/16/11 1950

## 2011-08-16 NOTE — ED Provider Notes (Signed)
History     CSN: [id number]  Arrival date & time 08/16/11  1228   First MD Initiated Contact with Patient 08/16/11 1252      Chief Complaint  Patient presents with  . Back Pain    Left, upper    (Consider location/radiation/quality/duration/timing/severity/associated sxs/prior treatment) The history is provided by the patient. No language interpreter was used.   31 y/o female with history of spontaneous pneumothorax last diagnosis in July of last year, which required chest tube placement, is presenting to the ED with chief complaints of left upper back pain which started yesterday. She noticed sharp, pleuritic chest pain to her left upper back after dinner last night.  At which time she denies sob.  She initially thought it was heart burn, and took some Tums.  This AM she notice pain has not improved.  Continue to endorse no SOB.  However, her pain felt similar to prior PTX and would like to be evaluated.  She denies fever, recent trauma, rash, n/v/d, abd pain.  She was a former smoker but has quit.  She denies cough or hemoptysis.  Has not taken birth control pill in 1 month and has no recent surg or recent travel.    Past Medical History  Diagnosis Date  . Pneumothorax on left   . Hx of left breast biopsy   . Hx of cervical biopsy   . History of tobacco abuse     Past Surgical History  Procedure Date  . Insertion of left chest tube 01/20/11    Dr Donata Clay  . Biopsy of cervix   . Breast biopsy   . Colposcopy vulva w/ biopsy     History reviewed. No pertinent family history.  History  Substance Use Topics  . Smoking status: Former Smoker    Types: Cigarettes    Quit date: 11/21/2010  . Smokeless tobacco: Never Used  . Alcohol Use: Yes     occasional    OB History    Grav Para Term Preterm Abortions TAB SAB Ect Mult Living   1 1 1              Review of Systems  All other systems reviewed and are negative.    Allergies  Penicillins  Home Medications    Current Outpatient Rx  Name Route Sig Dispense Refill  . ONE-DAILY MULTI VITAMINS PO TABS Oral Take 1 tablet by mouth daily.        BP 105/62  Pulse 80  Temp(Src) 98.5 F (36.9 C) (Oral)  Resp 18  Wt 125 lb (56.7 kg)  SpO2 100%  LMP 07/26/2011  Physical Exam  Nursing note and vitals reviewed. Constitutional: She appears well-developed and well-nourished. No distress.       Awake, alert, nontoxic appearance  HENT:  Head: Atraumatic.  Eyes: Conjunctivae are normal. Right eye exhibits no discharge. Left eye exhibits no discharge.  Neck: Neck supple.  Cardiovascular: Normal rate and regular rhythm.   Pulmonary/Chest: Effort normal and breath sounds normal. No respiratory distress. She has no wheezes. She has no rales. She exhibits no tenderness.  Abdominal: Soft. There is no tenderness. There is no rebound.  Musculoskeletal: She exhibits no tenderness.       ROM appears intact, no obvious focal weakness  Neurological:       Mental status and motor strength appears intact  Skin: No rash noted.  Psychiatric: She has a normal mood and affect.    ED Course  Procedures (including critical  care time)  Labs Reviewed - No data to display No results found.   No diagnosis found.  Results for orders placed in visit on 07/27/11  POCT PREGNANCY, URINE      Component Value Range   Preg Test, Ur NEGATIVE  NEGATIVE    Dg Chest 2 View  08/16/2011  *RADIOLOGY REPORT*  Clinical Data: 31 year old female with chest pain and shortness of breath.  History of pneumothorax.  CHEST - 2 VIEW  Comparison: 04/18/2011  Findings: A 15% left apical pneumothorax is identified.  The cardiomediastinal silhouette is unremarkable. There is no evidence of airspace disease, pleural effusion, pulmonary edema or acute bony abnormality.  IMPRESSION: 15% left apical pneumothorax.  Critical Value/emergent results were called by telephone at the time of interpretation on 08/16/2011  at 2:25 p.m.  to  Fayrene Helper,  who verbally acknowledged these results.  Original Report Authenticated By: Rosendo Gros, M.D.    CRITICAL CARE Performed by: Fayrene Helper   Total critical care time:  Critical care time was exclusive of separately billable procedures and treating other patients.  Critical care was necessary to treat or prevent imminent or life-threatening deterioration.  Critical care was time spent personally by me on the following activities: development of treatment plan with patient and/or surrogate as well as nursing, discussions with consultants, evaluation of patient's response to treatment, examination of patient, obtaining history from patient or surrogate, ordering and performing treatments and interventions, ordering and review of laboratory studies, ordering and review of radiographic studies, pulse oximetry and re-evaluation of patient's condition.   MDM  Hx of spontaneous pneumothorax.  Pt is tall and skinny.  Does admit to being flexible but denies prior diagnosis of Marfan Syndrome or other vascular disease.     2:47 PM Chest x-ray shows a 50% left apical pneumothorax. Results were discussed with cardiothoracic surgeon, Dr. Nydia Bouton. Being that this is a repeated pneumothorax on the same side, the cardio thoracic surgeon sts the patient may need surgery. Preop labs ordered. Pt last meal was this am when she ate a granola bar.  Result were discussed with my attending and with pt.  Pt given supplemental O2 at this time.  We'll continue to monitor and will likely admit pt.    Dr. Nydia Bouton will see pt in ED after he is done with surgery.      Fayrene Helper, PA-C 08/16/11 1520

## 2011-08-16 NOTE — ED Notes (Signed)
Pt from home c/o left side/upper back pain with inspiration that started yesterday. Pt reports history of spontaneous collapsed lung that happened 01/20/11 resulting in a chest tube and hospitalization for about a week. Pt denies shortness of breath but reports that symptoms are similar to what happened in July.

## 2011-08-16 NOTE — Discharge Instructions (Signed)
Pneumothorax You have a pneumothorax. This means that you have a partial collapse of one of your lungs. This condition may occur spontaneously, or may develop from a chest injury. People with a spontaneous pneumothorax can have a problem with repeated episodes. A catheter (chest tube) may be inserted between your ribs. This slowly removes the air from around the lung over a few days. Hospital care will likely be needed if you have a chest tube. Hospital care may be needed if your condition worsens, if the lung does not expand fully, or if you have other significant injuries like fractured ribs. Your condition does not appear to require hospital care at this time. Rest as much as possible and avoid any strenuous activity until the lung is normal and your doctor approves. Do not smoke or drink alcohol. Call your doctor or go to the emergency room at once if you develop increased chest pain, more shortness of breath, pain on both sides of the chest, or a fever. Document Released: 07/16/2004 Document Revised: 02/18/2011 Document Reviewed: 08/06/2008 [facility] Patient Information 2012 [place], [place].

## 2011-08-17 ENCOUNTER — Other Ambulatory Visit: Payer: Self-pay | Admitting: Thoracic Surgery

## 2011-08-17 DIAGNOSIS — J9311 Primary spontaneous pneumothorax: Secondary | ICD-10-CM

## 2011-08-18 ENCOUNTER — Ambulatory Visit (INDEPENDENT_AMBULATORY_CARE_PROVIDER_SITE_OTHER): Payer: Self-pay | Admitting: Thoracic Surgery

## 2011-08-18 ENCOUNTER — Encounter: Payer: Self-pay | Admitting: Thoracic Surgery

## 2011-08-18 ENCOUNTER — Ambulatory Visit
Admission: RE | Admit: 2011-08-18 | Discharge: 2011-08-18 | Disposition: A | Discharge status: Home / Self Care | Payer: No Typology Code available for payment source | Source: Ambulatory Visit | Attending: Thoracic Surgery | Admitting: Thoracic Surgery

## 2011-08-18 ENCOUNTER — Encounter (HOSPITAL_COMMUNITY): Payer: Self-pay | Admitting: Pharmacy Technician

## 2011-08-18 VITALS — BP 100/60 | HR 85 | Resp 16 | Ht 69.0 in | Wt 123.0 lb

## 2011-08-18 DIAGNOSIS — J9383 Other pneumothorax: Secondary | ICD-10-CM

## 2011-08-18 DIAGNOSIS — J9311 Primary spontaneous pneumothorax: Secondary | ICD-10-CM

## 2011-08-18 DIAGNOSIS — J939 Pneumothorax, unspecified: Secondary | ICD-10-CM

## 2011-08-18 NOTE — Progress Notes (Signed)
HPI patient went to the emergency room with a 15% pneumothorax. Chest x-ray today shows that is about 5-10%. This is [name] second episode. We will recommend that she get a VATS bullectomy with pleurodiesis. She agrees to the surgery we'll plan to do this on March 1 at [facility] and benefits of the procedure were explained to the patient. This is a high probability of success. She will return if she has increasing pain in the interim.  Current Outpatient Prescriptions  Medication Sig Dispense Refill  . Multiple Vitamin (MULTIVITAMIN) tablet Take 1 tablet by mouth daily.        Marland Kitchen albuterol (PROVENTIL HFA;VENTOLIN HFA) 108 (90 BASE) MCG/ACT inhaler Inhale 1 puff into the lungs every 4 (four) hours as needed. For shortness of breath.      Marland Kitchen HYDROcodone-acetaminophen (LORTAB) 7.5-500 MG/15ML solution Take 15 mLs by mouth every 6 (six) hours as needed for pain.  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   Plan: Left VATS resection of apical blebs with pleurodiesis

## 2011-08-19 ENCOUNTER — Other Ambulatory Visit: Payer: Self-pay

## 2011-08-19 DIAGNOSIS — J930 Spontaneous tension pneumothorax: Secondary | ICD-10-CM

## 2011-08-20 ENCOUNTER — Encounter (HOSPITAL_COMMUNITY): Payer: Self-pay

## 2011-08-20 ENCOUNTER — Other Ambulatory Visit: Payer: Self-pay

## 2011-08-20 ENCOUNTER — Encounter (HOSPITAL_COMMUNITY)
Admission: RE | Admit: 2011-08-20 | Discharge: 2011-08-20 | Disposition: A | Discharge status: Home / Self Care | Payer: Medicaid Other | Source: Ambulatory Visit | Attending: Thoracic Surgery | Admitting: Thoracic Surgery

## 2011-08-20 DIAGNOSIS — J930 Spontaneous tension pneumothorax: Secondary | ICD-10-CM

## 2011-08-20 LAB — COMPREHENSIVE METABOLIC PANEL
ALT: 11 U/L (ref 0–35)
AST: 14 U/L (ref 0–37)
CO2: 21 mEq/L (ref 19–32)
Chloride: 106 mEq/L (ref 96–112)
Creatinine, Ser: 1.11 mg/dL — ABNORMAL HIGH (ref 0.50–1.10)
GFR calc Af Amer: 78 mL/min — ABNORMAL LOW (ref >90)
GFR calc non Af Amer: 67 mL/min — ABNORMAL LOW (ref >90)
Glucose, Bld: 93 mg/dL (ref 70–99)
Sodium: 134 mEq/L — ABNORMAL LOW (ref 135–145)
Total Bilirubin: 0.3 mg/dL (ref 0.3–1.2)

## 2011-08-20 LAB — URINE MICROSCOPIC-ADD ON

## 2011-08-20 LAB — APTT: aPTT: 33 seconds (ref 24–37)

## 2011-08-20 LAB — CBC
Hemoglobin: 12 g/dL (ref 12.0–15.0)
MCH: 32.6 pg (ref 26.0–34.0)
MCV: 94.6 fL (ref 78.0–100.0)
Platelets: 243 10*3/uL (ref 150–400)
RBC: 3.68 MIL/uL — ABNORMAL LOW (ref 3.87–5.11)
WBC: 7.1 10*3/uL (ref 4.0–10.5)

## 2011-08-20 LAB — BLOOD GAS, ARTERIAL
Acid-base deficit: 2 mmol/L (ref 0.0–2.0)
Bicarbonate: 22.1 mEq/L (ref 20.0–24.0)
Drawn by: 206361
O2 Saturation: 97 %
pCO2 arterial: 36.4 mmHg (ref 35.0–45.0)
pO2, Arterial: 87.3 mmHg (ref 80.0–100.0)

## 2011-08-20 LAB — URINALYSIS, ROUTINE W REFLEX MICROSCOPIC
Ketones, ur: NEGATIVE mg/dL
Nitrite: NEGATIVE
Specific Gravity, Urine: 1.018 (ref 1.005–1.030)
pH: 6.5 (ref 5.0–8.0)

## 2011-08-20 NOTE — Pre-Procedure Instructions (Signed)
20 [name]  08/20/2011   Your procedure is scheduled on:  Monday August 24, 2011  Report to [facility] at 0530 AM.  Call this number if you have problems the morning of surgery: [phone]   Remember:   Do not eat food:After Midnight.  May have clear liquids: up to 4 Hours before arrival. (up to 1:30am)  Clear liquids include soda, tea, black coffee, apple or grape juice, broth.  Take these medicines the morning of surgery with A SIP OF WATER: albuterol   Do not wear jewelry, make-up or nail polish.  Do not wear lotions, powders, or perfumes. You may wear deodorant.  Do not shave 48 hours prior to surgery.  Do not bring valuables to the hospital.  Contacts, dentures or bridgework may not be worn into surgery.  Leave suitcase in the car. After surgery it may be brought to your room.  For patients admitted to the hospital, checkout time is 11:00 AM the day of discharge.   Patients discharged the day of surgery will not be allowed to drive home.  Name and phone number of your driver:  Family / Friend  Special Instructions: CHG Shower Use Special Wash: 1/2 bottle night before surgery and 1/2 bottle morning of surgery.   Please read over the following fact sheets that you were given: Pain Booklet, Coughing and Deep Breathing, Blood Transfusion Information, MRSA Information and Surgical Site Infection Prevention

## 2011-08-23 MED ORDER — VANCOMYCIN HCL IN DEXTROSE 1-5 GM/200ML-% IV SOLN
1000.0000 mg | INTRAVENOUS | Status: AC
  Administered 2011-08-24: 1000 mg via INTRAVENOUS
  Filled 2011-08-23: qty 200

## 2011-08-24 ENCOUNTER — Encounter: Payer: Medicaid Other | Admitting: Family Medicine

## 2011-08-24 ENCOUNTER — Ambulatory Visit (HOSPITAL_COMMUNITY): Payer: Medicaid Other | Admitting: Anesthesiology

## 2011-08-24 ENCOUNTER — Encounter (HOSPITAL_COMMUNITY): Payer: Self-pay | Admitting: Anesthesiology

## 2011-08-24 ENCOUNTER — Inpatient Hospital Stay (HOSPITAL_COMMUNITY)
Admission: RE | Admit: 2011-08-24 | Discharge: 2011-08-27 | DRG: 165 | Disposition: A | Discharge status: Home / Self Care | Payer: Medicaid Other | Source: Ambulatory Visit | Attending: Thoracic Surgery | Admitting: Thoracic Surgery

## 2011-08-24 ENCOUNTER — Encounter (HOSPITAL_COMMUNITY): Payer: Self-pay | Admitting: *Deleted

## 2011-08-24 ENCOUNTER — Ambulatory Visit (HOSPITAL_COMMUNITY): Payer: Medicaid Other

## 2011-08-24 ENCOUNTER — Encounter (HOSPITAL_COMMUNITY)
Admission: RE | Disposition: A | Discharge status: Home / Self Care | Payer: Self-pay | Source: Ambulatory Visit | Attending: Thoracic Surgery

## 2011-08-24 DIAGNOSIS — J930 Spontaneous tension pneumothorax: Secondary | ICD-10-CM

## 2011-08-24 DIAGNOSIS — Z79899 Other long term (current) drug therapy: Secondary | ICD-10-CM

## 2011-08-24 DIAGNOSIS — J439 Emphysema, unspecified: Secondary | ICD-10-CM | POA: Diagnosis present

## 2011-08-24 DIAGNOSIS — Z01810 Encounter for preprocedural cardiovascular examination: Secondary | ICD-10-CM

## 2011-08-24 DIAGNOSIS — Z01812 Encounter for preprocedural laboratory examination: Secondary | ICD-10-CM

## 2011-08-24 DIAGNOSIS — F17200 Nicotine dependence, unspecified, uncomplicated: Secondary | ICD-10-CM | POA: Diagnosis present

## 2011-08-24 DIAGNOSIS — J9383 Other pneumothorax: Principal | ICD-10-CM | POA: Diagnosis present

## 2011-08-24 HISTORY — PX: VIDEO ASSISTED THORACOSCOPY: SHX5073

## 2011-08-24 SURGERY — VIDEO ASSISTED THORACOSCOPY
Anesthesia: General | Site: Chest | Laterality: Left | Wound class: Clean Contaminated

## 2011-08-24 MED ORDER — 0.9 % SODIUM CHLORIDE (POUR BTL) OPTIME
TOPICAL | Status: DC | PRN
  Administered 2011-08-24: 1000 mL

## 2011-08-24 MED ORDER — OXYCODONE HCL 5 MG PO TABS
5.0000 mg | ORAL_TABLET | ORAL | Status: AC | PRN
  Filled 2011-08-24: qty 1

## 2011-08-24 MED ORDER — FENTANYL 10 MCG/ML IV SOLN
INTRAVENOUS | Status: DC
  Administered 2011-08-24: 10 ug via INTRAVENOUS
  Administered 2011-08-24: 15 ug via INTRAVENOUS
  Administered 2011-08-24: 45 ug via INTRAVENOUS
  Administered 2011-08-24: 30 ug/h via INTRAVENOUS
  Administered 2011-08-25: 30 ug via INTRAVENOUS
  Administered 2011-08-25: 45 ug via INTRAVENOUS
  Administered 2011-08-25 (×2): 15 ug via INTRAVENOUS
  Administered 2011-08-25 – 2011-08-26 (×2): 45 ug via INTRAVENOUS
  Administered 2011-08-26: 60 ug via INTRAVENOUS
  Filled 2011-08-24 (×2): qty 50

## 2011-08-24 MED ORDER — VECURONIUM BROMIDE 10 MG IV SOLR
INTRAVENOUS | Status: DC | PRN
  Administered 2011-08-24: 1 mg via INTRAVENOUS

## 2011-08-24 MED ORDER — DIPHENHYDRAMINE HCL 12.5 MG/5ML PO ELIX
12.5000 mg | ORAL_SOLUTION | Freq: Four times a day (QID) | ORAL | Status: DC | PRN
  Filled 2011-08-24: qty 5

## 2011-08-24 MED ORDER — PHENYLEPHRINE HCL 10 MG/ML IJ SOLN
INTRAMUSCULAR | Status: DC | PRN
  Administered 2011-08-24: 40 ug via INTRAVENOUS

## 2011-08-24 MED ORDER — ALBUTEROL SULFATE HFA 108 (90 BASE) MCG/ACT IN AERS
4.0000 | INHALATION_SPRAY | Freq: Four times a day (QID) | RESPIRATORY_TRACT | Status: AC
  Administered 2011-08-24 – 2011-08-26 (×6): 4 via RESPIRATORY_TRACT
  Filled 2011-08-24: qty 6.7

## 2011-08-24 MED ORDER — WHITE PETROLATUM GEL
Status: AC
  Filled 2011-08-24: qty 5

## 2011-08-24 MED ORDER — POTASSIUM CHLORIDE 10 MEQ/50ML IV SOLN
10.0000 meq | Freq: Every day | INTRAVENOUS | Status: DC | PRN
  Administered 2011-08-25 (×3): 10 meq via INTRAVENOUS
  Filled 2011-08-24 (×3): qty 50

## 2011-08-24 MED ORDER — OXYCODONE-ACETAMINOPHEN 5-325 MG PO TABS
1.0000 | ORAL_TABLET | ORAL | Status: DC | PRN
  Filled 2011-08-24: qty 1

## 2011-08-24 MED ORDER — PROPOFOL 10 MG/ML IV EMUL
INTRAVENOUS | Status: DC | PRN
  Administered 2011-08-24: 140 mg via INTRAVENOUS

## 2011-08-24 MED ORDER — SODIUM CHLORIDE 0.9 % IJ SOLN: 9.0000 mL | INTRAMUSCULAR | Status: DC | PRN

## 2011-08-24 MED ORDER — SENNOSIDES-DOCUSATE SODIUM 8.6-50 MG PO TABS
1.0000 | ORAL_TABLET | Freq: Every evening | ORAL | Status: DC | PRN
  Filled 2011-08-24: qty 1

## 2011-08-24 MED ORDER — FENTANYL CITRATE 0.05 MG/ML IJ SOLN
INTRAMUSCULAR | Status: DC | PRN
  Administered 2011-08-24 (×3): 50 ug via INTRAVENOUS
  Administered 2011-08-24: 100 ug via INTRAVENOUS
  Administered 2011-08-24: 50 ug via INTRAVENOUS

## 2011-08-24 MED ORDER — LACTATED RINGERS IV SOLN
INTRAVENOUS | Status: DC | PRN
  Administered 2011-08-24: 07:00:00 via INTRAVENOUS

## 2011-08-24 MED ORDER — ONDANSETRON HCL 4 MG/2ML IJ SOLN
4.0000 mg | Freq: Four times a day (QID) | INTRAMUSCULAR | Status: DC | PRN
  Administered 2011-08-25: 4 mg via INTRAVENOUS
  Filled 2011-08-24: qty 2

## 2011-08-24 MED ORDER — VANCOMYCIN HCL IN DEXTROSE 1-5 GM/200ML-% IV SOLN
1000.0000 mg | Freq: Two times a day (BID) | INTRAVENOUS | Status: AC
  Administered 2011-08-24: 1000 mg via INTRAVENOUS
  Filled 2011-08-24: qty 200

## 2011-08-24 MED ORDER — LACTATED RINGERS IV SOLN
INTRAVENOUS | Status: DC | PRN
  Administered 2011-08-24 (×2): via INTRAVENOUS

## 2011-08-24 MED ORDER — TRAMADOL HCL 50 MG PO TABS: 50.0000 mg | ORAL_TABLET | Freq: Four times a day (QID) | ORAL | Status: DC | PRN

## 2011-08-24 MED ORDER — BISACODYL 5 MG PO TBEC
10.0000 mg | DELAYED_RELEASE_TABLET | Freq: Every day | ORAL | Status: DC
  Administered 2011-08-25 – 2011-08-26 (×2): 10 mg via ORAL
  Filled 2011-08-24: qty 1
  Filled 2011-08-24 (×2): qty 2

## 2011-08-24 MED ORDER — BUPIVACAINE 0.5 % ON-Q PUMP SINGLE CATH 400 ML
400.0000 mL | INJECTION | Status: AC
  Administered 2011-08-24: 400 mL
  Filled 2011-08-24: qty 400

## 2011-08-24 MED ORDER — DIPHENHYDRAMINE HCL 50 MG/ML IJ SOLN: 12.5000 mg | Freq: Four times a day (QID) | INTRAMUSCULAR | Status: DC | PRN

## 2011-08-24 MED ORDER — ONDANSETRON HCL 4 MG/2ML IJ SOLN
INTRAMUSCULAR | Status: DC | PRN
  Administered 2011-08-24: 4 mg via INTRAVENOUS

## 2011-08-24 MED ORDER — GLYCOPYRROLATE 0.2 MG/ML IJ SOLN
INTRAMUSCULAR | Status: DC | PRN
  Administered 2011-08-24: .7 mg via INTRAVENOUS

## 2011-08-24 MED ORDER — NEOSTIGMINE METHYLSULFATE 1 MG/ML IJ SOLN
INTRAMUSCULAR | Status: DC | PRN
  Administered 2011-08-24: 4 mg via INTRAVENOUS

## 2011-08-24 MED ORDER — DEXTROSE-NACL 5-0.9 % IV SOLN
INTRAVENOUS | Status: DC
  Administered 2011-08-24: 15:00:00 via INTRAVENOUS
  Administered 2011-08-25: 20 mL/h via INTRAVENOUS

## 2011-08-24 MED ORDER — ONDANSETRON HCL 4 MG/2ML IJ SOLN: 4.0000 mg | Freq: Four times a day (QID) | INTRAMUSCULAR | Status: DC | PRN

## 2011-08-24 MED ORDER — MIDAZOLAM HCL 5 MG/5ML IJ SOLN
INTRAMUSCULAR | Status: DC | PRN
  Administered 2011-08-24: 2 mg via INTRAVENOUS

## 2011-08-24 MED ORDER — OXYCODONE-ACETAMINOPHEN 5-325 MG PO TABS: 1.0000 | ORAL_TABLET | ORAL | Status: DC | PRN

## 2011-08-24 MED ORDER — MEPERIDINE HCL 25 MG/ML IJ SOLN: 6.2500 mg | INTRAMUSCULAR | Status: DC | PRN

## 2011-08-24 MED ORDER — ROCURONIUM BROMIDE 100 MG/10ML IV SOLN
INTRAVENOUS | Status: DC | PRN
  Administered 2011-08-24: 50 mg via INTRAVENOUS

## 2011-08-24 MED ORDER — ACETAMINOPHEN 10 MG/ML IV SOLN
1000.0000 mg | Freq: Four times a day (QID) | INTRAVENOUS | Status: AC
  Administered 2011-08-24 – 2011-08-25 (×3): 1000 mg via INTRAVENOUS
  Filled 2011-08-24 (×4): qty 100

## 2011-08-24 MED ORDER — BUPIVACAINE ON-Q PAIN PUMP (FOR ORDER SET NO CHG)
INJECTION | Status: DC
  Filled 2011-08-24: qty 1

## 2011-08-24 MED ORDER — HYDROMORPHONE HCL PF 1 MG/ML IJ SOLN
0.2500 mg | INTRAMUSCULAR | Status: DC | PRN
  Administered 2011-08-24 (×2): 0.5 mg via INTRAVENOUS

## 2011-08-24 MED ORDER — BUPIVACAINE-EPINEPHRINE 0.25% -1:200000 IJ SOLN
INTRAMUSCULAR | Status: DC | PRN
  Administered 2011-08-24: 30 mL

## 2011-08-24 MED ORDER — NALOXONE HCL 0.4 MG/ML IJ SOLN: 0.4000 mg | INTRAMUSCULAR | Status: DC | PRN

## 2011-08-24 SURGICAL SUPPLY — 85 items
ADH SKN CLS APL DERMABOND .7 (GAUZE/BANDAGES/DRESSINGS) ×1
APL SKNCLS STERI-STRIP NONHPOA (GAUZE/BANDAGES/DRESSINGS) ×1
APL SRG 22X2 LUM MLBL SLNT (VASCULAR PRODUCTS)
APPLICATOR TIP EXT COSEAL (VASCULAR PRODUCTS) IMPLANT
APPLIER CLIP ROT 10 11.4 M/L (STAPLE)
APR CLP MED LRG 11.4X10 (STAPLE)
BENZOIN TINCTURE PRP APPL 2/3 (GAUZE/BANDAGES/DRESSINGS) ×2 IMPLANT
CANISTER SUCTION 2500CC (MISCELLANEOUS) ×2 IMPLANT
CATH HYDRAGLIDE XL THORACIC (CATHETERS) ×2 IMPLANT
CATH KIT ON Q 5IN SLV (PAIN MANAGEMENT) ×1 IMPLANT
CATH THORACIC 28FR (CATHETERS) IMPLANT
CATH THORACIC 28FR RT ANG (CATHETERS) IMPLANT
CATH THORACIC 36FR (CATHETERS) IMPLANT
CATH THORACIC 36FR RT ANG (CATHETERS) IMPLANT
CATH URET ROBINSON 16FR STRL (CATHETERS) IMPLANT
CLEANER TIP ELECTROSURG 2X2 (MISCELLANEOUS) ×1 IMPLANT
CLIP APPLIE ROT 10 11.4 M/L (STAPLE) IMPLANT
CLIP TI MEDIUM 6 (CLIP) ×2 IMPLANT
CLOTH BEACON ORANGE TIMEOUT ST (SAFETY) ×2 IMPLANT
CONN Y 3/8X3/8X3/8  BEN (MISCELLANEOUS)
CONN Y 3/8X3/8X3/8 BEN (MISCELLANEOUS) IMPLANT
CONT SPEC 4OZ CLIKSEAL STRL BL (MISCELLANEOUS) ×4 IMPLANT
COVER SURGICAL LIGHT HANDLE (MISCELLANEOUS) ×4 IMPLANT
DECANTER SPIKE VIAL GLASS SM (MISCELLANEOUS) ×2 IMPLANT
DERMABOND ADVANCED (GAUZE/BANDAGES/DRESSINGS) ×1
DERMABOND ADVANCED .7 DNX12 (GAUZE/BANDAGES/DRESSINGS) ×1 IMPLANT
DRAPE CAMERA CLOSED 9X96 (DRAPES) ×2 IMPLANT
DRAPE LAPAROSCOPIC ABDOMINAL (DRAPES) ×2 IMPLANT
DRAPE WARM FLUID 44X44 (DRAPE) ×2 IMPLANT
DRILL BIT 7/64X5 (BIT) ×1 IMPLANT
DRSG TEGADERM 4X4.75 (GAUZE/BANDAGES/DRESSINGS) ×1 IMPLANT
ELECT REM PT RETURN 9FT ADLT (ELECTROSURGICAL) ×2
ELECTRODE REM PT RTRN 9FT ADLT (ELECTROSURGICAL) ×1 IMPLANT
GLOVE BIO SURGEON STRL SZ 6.5 (GLOVE) ×1 IMPLANT
GLOVE BIOGEL PI IND STRL 6.5 (GLOVE) IMPLANT
GLOVE BIOGEL PI IND STRL 7.5 (GLOVE) IMPLANT
GLOVE BIOGEL PI INDICATOR 6.5 (GLOVE) ×3
GLOVE BIOGEL PI INDICATOR 7.5 (GLOVE) ×1
GLOVE SURG SIGNA 7.5 PF LTX (GLOVE) ×4 IMPLANT
GLOVE SURG SS PI 7.5 STRL IVOR (GLOVE) ×1 IMPLANT
GOWN BRE IMP PREV XXLGXLNG (GOWN DISPOSABLE) ×2 IMPLANT
GOWN PREVENTION PLUS XLARGE (GOWN DISPOSABLE) ×1 IMPLANT
GOWN STRL NON-REIN LRG LVL3 (GOWN DISPOSABLE) ×4 IMPLANT
HANDLE STAPLE ENDO GIA SHORT (STAPLE) ×1
HEMOSTAT SURGICEL 2X14 (HEMOSTASIS) IMPLANT
KIT BASIN OR (CUSTOM PROCEDURE TRAY) ×2 IMPLANT
KIT ROOM TURNOVER OR (KITS) ×2 IMPLANT
LIGASURE 5MM LAPAROSCOPIC (INSTRUMENTS) IMPLANT
NS IRRIG 1000ML POUR BTL (IV SOLUTION) ×4 IMPLANT
PACK CHEST (CUSTOM PROCEDURE TRAY) ×2 IMPLANT
PAD ARMBOARD 7.5X6 YLW CONV (MISCELLANEOUS) ×4 IMPLANT
RELOAD EGIA 45 MED/THCK PURPLE (STAPLE) ×2 IMPLANT
RELOAD ENDO GIA 30 3.5 (STAPLE) ×1 IMPLANT
SEALANT PROGEL (MISCELLANEOUS) IMPLANT
SEALANT SURG COSEAL 4ML (VASCULAR PRODUCTS) IMPLANT
SEALANT SURG COSEAL 8ML (VASCULAR PRODUCTS) IMPLANT
SOLUTION ANTI FOG 6CC (MISCELLANEOUS) ×2 IMPLANT
SPONGE GAUZE 4X4 12PLY (GAUZE/BANDAGES/DRESSINGS) ×2 IMPLANT
STAPLER ENDO GIA 12 SHRT THIN (STAPLE) IMPLANT
STAPLER ENDO GIA 12MM SHORT (STAPLE) ×1 IMPLANT
STRIP PERI DRY VERITAS 45 (STAPLE) IMPLANT
STRIP PERI DRY VERITAS 60 (STAPLE) IMPLANT
SUT ETHILON 3 0 PS 1 (SUTURE) ×1 IMPLANT
SUT PROLENE 3 0 SH DA (SUTURE) IMPLANT
SUT PROLENE 4 0 RB 1 (SUTURE)
SUT PROLENE 4-0 RB1 .5 CRCL 36 (SUTURE) IMPLANT
SUT SILK 0 FSL (SUTURE) ×8 IMPLANT
SUT SILK 2 0SH CR/8 30 (SUTURE) IMPLANT
SUT VIC AB 1 CTX 18 (SUTURE) IMPLANT
SUT VIC AB 1 CTX 36 (SUTURE)
SUT VIC AB 1 CTX36XBRD ANBCTR (SUTURE) IMPLANT
SUT VIC AB 2-0 CTX 36 (SUTURE) IMPLANT
SUT VIC AB 3-0 MH 27 (SUTURE) ×2 IMPLANT
SUT VIC AB 3-0 X1 27 (SUTURE) ×2 IMPLANT
SUT VICRYL 2 TP 1 (SUTURE) IMPLANT
SYSTEM SAHARA CHEST DRAIN RE-I (WOUND CARE) ×2 IMPLANT
TAPE CLOTH 4X10 WHT NS (GAUZE/BANDAGES/DRESSINGS) ×2 IMPLANT
TAPE CLOTH SURG 4X10 WHT LF (GAUZE/BANDAGES/DRESSINGS) ×1 IMPLANT
TAPE UMBILICAL COTTON 1/8X30 (MISCELLANEOUS) IMPLANT
TIP APPLICATOR SPRAY EXTEND 16 (VASCULAR PRODUCTS) IMPLANT
TOWEL OR 17X24 6PK STRL BLUE (TOWEL DISPOSABLE) ×4 IMPLANT
TOWEL OR 17X26 10 PK STRL BLUE (TOWEL DISPOSABLE) ×4 IMPLANT
TRAY FOLEY CATH 14FRSI W/METER (CATHETERS) ×2 IMPLANT
TUNNELER SHEATH ON-Q 11GX8 (MISCELLANEOUS) ×1 IMPLANT
WATER STERILE IRR 1000ML POUR (IV SOLUTION) ×4 IMPLANT

## 2011-08-24 NOTE — Brief Op Note (Signed)
08/24/2011  8:37 AM  PATIENT:  [name]  31 y.o. female  PRE-OPERATIVE DIAGNOSIS:  RECURRENT PNEUMOTHORAX  POST-OPERATIVE DIAGNOSIS:  RECURRENT PNEUMOTHORAX  PROCEDURE:  Procedure(s) (LRB): VIDEO ASSISTED THORACOSCOPY (Left)  SURGEON:  Surgeon(s) and Role:    * D Karle Plumber, MD - Primary  PHYSICIAN ASSISTANT:   ASSISTANTS: Gershon Crane PAC   ANESTHESIA:   general  EBL:  Total I/O In: 1900 [I.V.:1900] Out: 130 [Urine:125; Blood:5]  BLOOD ADMINISTERED:none  DRAINS: two Chest Tube(s) in the pleura   LOCAL MEDICATIONS USED:  MARCAINE     SPECIMEN:  Source of Specimen:  lul  DISPOSITION OF SPECIMEN:  PATHOLOGY  COUNTS:  YES  TOURNIQUET:  * No tourniquets in log *  DICTATION: .Other Dictation: Dictation Number [id number]  PLAN OF CARE: Admit to inpatient   PATIENT DISPOSITION:  PACU - hemodynamically stable.   Delay start of Pharmacological VTE agent (>24hrs) due to surgical blood loss or risk of bleeding: yes

## 2011-08-24 NOTE — Interval H&P Note (Signed)
History and Physical Interval Note:  08/24/2011 7:09 AM  [name]  has presented today for surgery, with the diagnosis of RECURRENT PNEUMOTHORAX  The various methods of treatment have been discussed with the patient and family. After consideration of risks, benefits and other options for treatment, the patient has consented to  Procedure(s) (LRB): VIDEO ASSISTED THORACOSCOPY (Left) as a surgical intervention .  The patients' history has been reviewed, patient examined, no change in status, stable for surgery.  I have reviewed the patients' chart and labs.  Questions were answered to the patient's satisfaction.     Cameron Proud

## 2011-08-24 NOTE — Transfer of Care (Signed)
Immediate Anesthesia Transfer of Care Note  Patient: [name]  Procedure(s) Performed: Procedure(s) (LRB): VIDEO ASSISTED THORACOSCOPY (Left)  Patient Location: PACU  Anesthesia Type: General  Level of Consciousness: sedated  Airway & Oxygen Therapy: Patient Spontanous Breathing and Patient connected to nasal cannula oxygen  Post-op Assessment: Report given to PACU RN and Post -op Vital signs reviewed and stable  Post vital signs: Reviewed  Complications: No apparent anesthesia complications

## 2011-08-24 NOTE — Anesthesia Preprocedure Evaluation (Addendum)
Anesthesia Evaluation  Patient identified by MRN, date of birth, ID band Patient awake    Reviewed: Allergy & Precautions, H&P , NPO status , Patient's Chart, lab work & pertinent test results, reviewed documented beta blocker date and time   Airway Mallampati: II TM Distance: >3 FB Neck ROM: Full    Dental No notable dental hx. (+) Teeth Intact and Dental Advisory Given   Pulmonary neg pulmonary ROS, Current Smoker,  breath sounds clear to auscultation  Pulmonary exam normal       Cardiovascular negative cardio ROS  Rhythm:Regular Rate:Normal     Neuro/Psych negative neurological ROS  negative psych ROS   GI/Hepatic negative GI ROS, Neg liver ROS,   Endo/Other  negative endocrine ROS  Renal/GU negative Renal ROS  negative genitourinary   Musculoskeletal   Abdominal   Peds  Hematology negative hematology ROS (+)   Anesthesia Other Findings   Reproductive/Obstetrics negative OB ROS                          Anesthesia Physical Anesthesia Plan  ASA: II  Anesthesia Plan: General   Post-op Pain Management:    Induction: Intravenous  Airway Management Planned: Oral ETT  Additional Equipment: Arterial line, CVP and Ultrasound Guidance Line Placement  Intra-op Plan:   Post-operative Plan: Extubation in OR  Informed Consent: I have reviewed the patients History and Physical, chart, labs and discussed the procedure including the risks, benefits and alternatives for the proposed anesthesia with the patient or authorized representative who has indicated his/her understanding and acceptance.   Dental advisory given  Plan Discussed with: CRNA and Anesthesiologist  Anesthesia Plan Comments:         Anesthesia Quick Evaluation

## 2011-08-24 NOTE — Preoperative (Signed)
Beta Blockers   Reason not to administer Beta Blockers:Not Applicable. No home beta blockers 

## 2011-08-24 NOTE — Anesthesia Procedure Notes (Signed)
Procedure Name: Intubation Date/Time: 08/24/2011 7:27 AM Performed by: Margaree Mackintosh Pre-anesthesia Checklist: Patient identified, Timeout performed, Emergency Drugs available, Suction available and Patient being monitored Patient Re-evaluated:Patient Re-evaluated prior to inductionOxygen Delivery Method: Circle system utilized Preoxygenation: Pre-oxygenation with 100% oxygen Intubation Type: IV induction Ventilation: Mask ventilation without difficulty Laryngoscope Size: Mac and 3 Grade View: Grade I Tube type: Oral Endobronchial tube: Double lumen EBT, EBT position confirmed by fiberoptic bronchoscope and EBT position confirmed by auscultation and 39 Fr Number of attempts: 1 Airway Equipment and Method: Stylet Placement Confirmation: ETT inserted through vocal cords under direct vision,  breath sounds checked- equal and bilateral and positive ETCO2 Secured at: 29 cm Tube secured with: Tape Dental Injury: Teeth and Oropharynx as per pre-operative assessment

## 2011-08-24 NOTE — H&P (View-Only) (Signed)
[name] is an 31 y.o. female. 03/27/1981 MRN:[id number]   Chief Complaint: Posterior left chest pain  HPI: This is a 31 year African American female with a history of left spontaneous pneumothorax requiring a left chest tube) July 2012.She was in her normal state of health until late last evening when she began experiencing sharp, left pleuritic chest pain. This was similar to the pain she had with her previous left spontaneous pneumothorax. She took a Tums as she thought she might have heartburn, but that did not relieve her symptoms.She denied any shortness of breath, fever, chills, hemoptysis, or cough. She presented to [facility] for further evaluation and treatment.Her vital signs are blood pressure is116/62, heart rate 71, O2 Sat 100% on 2 L via [place] and she is no acute distress.A thoracic evaluation was requested.  Past Medical History:  Diagnosis Date  . Pneumothorax on left July 2012   . History of tobacco abuse     Past Surgical History  Procedure Date  . Insertion of left chest tube 01/20/11    Dr Van Trigt  . Biopsy of cervix   . Breast biopsy   . Colposcopy vulva w/ biopsy    Family History:Mother is alive at age 46. No health problems.Father is alive at 48 and has GERD.  Social History:  She reports that she quit smoking about 8 months ago. Her smoking use included Cigarettes. She has never used smokeless tobacco. She reports that she drinks alcohol socially. She reports that she does not use illicit drugs.  Allergies:  Allergies  Allergen Reactions  . Penicillins Rash     Medications Prior to Admission:  Medication Sig Dispense Refill  . Multiple Vitamin (MULTIVITAMIN) tablet Take 1 tablet by mouth daily.          Results for orders placed during the hospital encounter of 08/16/11 (from the past 48 hour(s))  ABO/RH     Status: Normal   Collection Time   08/16/11  3:00 PM      Component Value Range Comment   ABO/RH(D) A POS     CBC     Status:  Normal   Collection Time   08/16/11  3:05 PM      Component Value Range Comment   WBC 7.2  4.0 - 10.5 (K/uL)    RBC 3.99  3.87 - 5.11 (MIL/uL)    Hemoglobin 13.2  12.0 - 15.0 (g/dL)    HCT 38.0  36.0 - 46.0 (%)    MCV 95.2  78.0 - 100.0 (fL)    MCH 33.1  26.0 - 34.0 (pg)    MCHC 34.7  30.0 - 36.0 (g/dL)    RDW 12.5  11.5 - 15.5 (%)    Platelets 281  150 - 400 (K/uL)   DIFFERENTIAL     Status: Normal   Collection Time   08/16/11  3:05 PM      Component Value Range Comment   Neutrophils Relative 64  43 - 77 (%)    Neutro Abs 4.6  1.7 - 7.7 (K/uL)    Lymphocytes Relative 27  12 - 46 (%)    Lymphs Abs 2.0  0.7 - 4.0 (K/uL)    Monocytes Relative 7  3 - 12 (%)    Monocytes Absolute 0.5  0.1 - 1.0 (K/uL)    Eosinophils Relative 1  0 - 5 (%)    Eosinophils Absolute 0.1  0.0 - 0.7 (K/uL)    Basophils Relative 0    0 - 1 (%)    Basophils Absolute 0.0  0.0 - 0.1 (K/uL)   BASIC METABOLIC PANEL     Status: Abnormal   Collection Time   08/16/11  3:05 PM      Component Value Range Comment   Sodium 133 (*) 135 - 145 (mEq/L)    Potassium 4.0  3.5 - 5.1 (mEq/L)    Chloride 101  96 - 112 (mEq/L)    CO2 25  19 - 32 (mEq/L)    Glucose, Bld 93  70 - 99 (mg/dL)    BUN 13  6 - 23 (mg/dL)    Creatinine, Ser 0.78  0.50 - 1.10 (mg/dL)    Calcium 9.9  8.4 - 10.5 (mg/dL)    GFR calc non Af Amer >90  >90 (mL/min)    GFR calc Af Amer >90  >90 (mL/min)   TYPE AND SCREEN     Status: Normal (Preliminary result)   Collection Time   08/16/11  3:05 PM      Component Value Range Comment   ABO/RH(D) A POS      Antibody Screen PENDING      Sample Expiration 08/19/2011      Dg Chest 2 View  08/16/2011  *RADIOLOGY REPORT*  Clinical Data: 31-year-old female with chest pain and shortness of breath.  History of pneumothorax.  CHEST - 2 VIEW  Comparison: 04/18/2011  Findings: A 15% left apical pneumothorax is identified.  The cardiomediastinal silhouette is unremarkable. There is no evidence of airspace disease,  pleural effusion, pulmonary edema or acute bony abnormality.  IMPRESSION: 15% left apical pneumothorax.  Critical Value/emergent results were called by telephone at the time of interpretation on 08/16/2011  at 2:25 p.m.  to  Bowie Tran, who verbally acknowledged these results.  Original Report Authenticated By: JEFFREY T. HU, M.D.   Review of Systems: General: normal appetite, normal energy  Respiratory: No shortness of breath, no wheezing, no hemoptysis, no pain with inspiration or cough,  Cardiac: No chest pain or tightness, no exertional SOB, no resting SOB,  no LE edema, no palpitations, no syncope  GI: no difficulty swallowing, no hematochezia, no hematemesis, no melena, no constipation, no diarrhea  GU: no dysuria, no urgency, no frequency  Musculoskeletal: no arthritis, no arthralgia  Neuro: no symptoms suggestive of TIA's, no seizures, no headaches, no peripheral neuropathy  Endocrine: Negative  HEENT: no loose teeth or painful teeth, no recent vision changes  Psych:  no depression    Vital Signs: Blood pressure 116/62, pulse 80, temperature 98.5 F (36.9 C), temperature source Oral, resp. rate 22, weight 125 lb (56.7 kg), last menstrual period 07/26/2011, SpO2 100.00%.  Physical Exam: HEENT:Head is atraumatic and normocephalic. Eyes: EOMI, PERRLA. Neck:supple, no JVD, no lymphadenopathy Cardiovascular:RRR, no murmurs, gallop, or rub. Pulmonary: Slightly decreased at left apex;otherwise, clear.No tachypnea or respiratory distress. Abdomen:Soft, non tender, bowel sounds present, no rebound or guarding. Extremities:No cyanosis, clubbing, or edema.Palpable DP and PT bilaterally. Musculoskeletal: Motor/sensory intact. No focal weakness. Neurologic:Cranial nerves II-XII grossly intact without any focal deficits.  Assessment/Plan: Recurrent left spontaneous pneumothorax. PA/LAT CXR shows a 15% left apical pneumothorax. Per discussion with Dr. Gerhardt, she does not require a chest  tube at this point. She will ultimately require a left VATS. We are going to observe her  in the ER over  the next several hours.Provided her vital signs and chest xray remain stable, she will be discharged home.The patient is agreeable to this as she needs to return   home to take care of her young son. We will arrange for a follow up in our office on Monday 08/17/2011.At that time, potential risks, complications, and benefits of the surgery will be discussed with the patient. Surgery will then be scheduled.Patient was instructed that if in the interim, she has increasing pain or shortness of breath she is  to come to the ER immediately.I discussed this plan with Dr. Hosmer as well.  Manie Bealer M, PA-C 08/16/2011, 4:07 PM     

## 2011-08-24 NOTE — Brief Op Note (Signed)
                   [address]            [place] [address]          [phone]    08/24/2011  8:35 AM  PATIENT:  [name]  31 y.o. female  PRE-OPERATIVE DIAGNOSIS:  RECURRENT PNEUMOTHORAX  POST-OPERATIVE DIAGNOSIS:  RECURRENT PNEUMOTHORAX  PROCEDURE:  Procedure(s): VIDEO ASSISTED THORACOSCOPY(LEFT); LUL WEDGE RESECTION; MECHANICAL PLEURODESIS/PLEURECTOMY  SURGEON:  Surgeon(s): D Karle Plumber, MD  PHYSICIAN ASSISTANT: Alonzo Loving PA-C  ANESTHESIA:   regional and general  SPECIMEN:  Source of Specimen:  LUL WEDGE, PLEURAL PEEL  DISPOSITION OF SPECIMEN:  Pathology  DRAINS: (2) Blake drain(s) in the L HEMITHORAX   PATIENT CONDITION:  PACU - hemodynamically stable.  COMPLICATIONS: NO KNOWN

## 2011-08-24 NOTE — Anesthesia Postprocedure Evaluation (Signed)
  Anesthesia Post-op Note  Patient: [name]  Procedure(s) Performed: Procedure(s) (LRB): VIDEO ASSISTED THORACOSCOPY (Left)  Patient Location: PACU  Anesthesia Type: General  Level of Consciousness: awake and alert   Airway and Oxygen Therapy: Patient Spontanous Breathing and Patient connected to nasal cannula oxygen  Post-op Pain: mild  Post-op Assessment: Post-op Vital signs reviewed, Patient's Cardiovascular Status Stable, Respiratory Function Stable, Patent Airway and No signs of Nausea or vomiting  Post-op Vital Signs: Reviewed and stable  Complications: No apparent anesthesia complications

## 2011-08-25 ENCOUNTER — Inpatient Hospital Stay (HOSPITAL_COMMUNITY): Payer: Medicaid Other

## 2011-08-25 LAB — BASIC METABOLIC PANEL
BUN: 4 mg/dL — ABNORMAL LOW (ref 6–23)
Calcium: 8.2 mg/dL — ABNORMAL LOW (ref 8.4–10.5)
Creatinine, Ser: 0.73 mg/dL (ref 0.50–1.10)
GFR calc non Af Amer: >90 mL/min (ref >90)
Glucose, Bld: 118 mg/dL — ABNORMAL HIGH (ref 70–99)
Sodium: 139 mEq/L (ref 135–145)

## 2011-08-25 LAB — CBC
HCT: 29.2 % — ABNORMAL LOW (ref 36.0–46.0)
Hemoglobin: 9.9 g/dL — ABNORMAL LOW (ref 12.0–15.0)
MCH: 33.1 pg (ref 26.0–34.0)
MCHC: 33.9 g/dL (ref 30.0–36.0)
MCV: 97.7 fL (ref 78.0–100.0)

## 2011-08-25 MED ORDER — SODIUM CHLORIDE 0.9 % IJ SOLN
INTRAMUSCULAR | Status: AC
  Filled 2011-08-25: qty 30

## 2011-08-25 MED ORDER — POLYSACCHARIDE IRON COMPLEX 150 MG PO CAPS
150.0000 mg | ORAL_CAPSULE | Freq: Every day | ORAL | Status: DC
  Administered 2011-08-25: 150 mg via ORAL
  Filled 2011-08-25 (×3): qty 1

## 2011-08-25 NOTE — Op Note (Signed)
NAME:  [name], [name] NO.:  [id number]  MEDICAL RECORD NO.:  [id number]  LOCATION:  [facility]                         FACILITY:  [facility]  PHYSICIAN:  Ines Bloomer, M.D. DATE OF BIRTH:  [birth year]  DATE OF PROCEDURE: DATE OF DISCHARGE:                              OPERATIVE REPORT   PREOPERATIVE DIAGNOSIS:  Persistent left pneumothoraces, apical blebs.  POSTOPERATIVE DIAGNOSIS:  Resection of apical blebs with partial pleurectomy and pleurodesis.  SURGEON:  Ines Bloomer, MD  FIRST ASSISTANT:  Rowe Clack, PA-C  ANESTHESIA:  General.  DESCRIPTION:  After percutaneous insertion of all monitoring lines, the patient underwent general anesthesia, turned in left lateral thoracotomy position, was prepped and draped in usual sterile manner.  Two trocar sites were made after the left lung was deflated, one in the fifth intercostal space at the posterior axillary line and one at the fourth intercostal space at the anterior axillary line used at the previous chest tube site.  After the trocars were inserted, we entered a zero scope and took pictures of multiple blebs at the top of her lung.  We then made a third trocar site with a 5-mm probe and inserted a grasper. Grasper grasped the apex of the lung and then bringing a Covidien purple stapler from the medial posterior trocar sites, we resected the apex of the lung with 3 applications.  We moved through the inferior trocar site.  Then, we inserted both a 4S and a 2S Kaiser ring forceps and stripped off the pleura down to the fifth rib, and then did a pleurodesis with a Bovie scraper from the fifth down to the eighth rib. Two chest tubes were placed through the trocar site and tied in place with 0 silk.  Marcaine block was done in the usual fashion.  A single On- Q was inserted in the usual fashion.  Two 24 chest tubes were sutured in place with 2-0 silk.  The third trocar site was closed with a small 3-0 Vicryl.   The patient tolerated the procedure well, was turned to recovery room in stable condition.     Ines Bloomer, M.D.     DPB/MEDQ  D:  08/24/2011  T:  08/24/2011  Job:  [id number]

## 2011-08-25 NOTE — Progress Notes (Signed)
UR completed. [name] 08/25/2011 [phone]

## 2011-08-25 NOTE — Progress Notes (Signed)
                                              1 Day Post-Op Procedure(s) (LRB): VIDEO ASSISTED THORACOSCOPY (Left) Subjective: Patient is stable. No air leak. Hct 29. K 3.4.Doing well. Will decrease suction.   Objective: Vital signs in last 24 hours: Temp:  [97.2 F (36.2 C)-99.1 F (37.3 C)] 99.1 F (37.3 C) (03/05 0400) Pulse Rate:  [66-93] 81  (03/05 0313) Cardiac Rhythm:  [-] Normal sinus rhythm (03/05 0400) Resp:  [13-28] 15  (03/05 0412) BP: (102-124)/(57-76) 104/57 mmHg (03/05 0400) SpO2:  [100 %] 100 % (03/05 0412) Arterial Line BP: (125-133)/(65-73) 133/65 mmHg (03/04 0940) FiO2 (%):  [100 %] 100 % (03/04 1600) Weight:  [51.1 kg (112 lb 10.5 oz)] 51.1 kg (112 lb 10.5 oz) (03/04 1300)  Hemodynamic parameters for last 24 hours:    Intake/Output from previous day: 03/04 0701 - 03/05 0700 In: 3500 [I.V.:3500] Out: 2230 [Urine:1850; Blood:5; Chest Tube:375] Intake/Output this shift:    General appearance: alert  Lab Results:  [facility] 08/25/11 0427  WBC 7.9  HGB 9.9*  HCT 29.2*  PLT 202   BMET:  Basename 08/25/11 0427  NA 139  K 3.4*  CL 110  CO2 25  GLUCOSE 118*  BUN 4*  CREATININE 0.73  CALCIUM 8.2*    PT/INR: No results found for this basename: LABPROT,INR in the last 72 hours ABG    Component Value Date/Time   PHART 7.400 08/20/2011 1545   HCO3 22.1 08/20/2011 1545   TCO2 23.2 08/20/2011 1545   ACIDBASEDEF 2.0 08/20/2011 1545   O2SAT 97.0 08/20/2011 1545   CBG (last 3)  No results found for this basename: GLUCAP:3 in the last 72 hours  Assessment/Plan: S/P Procedure(s) (LRB): VIDEO ASSISTED THORACOSCOPY (Left) See progression orders Decrease suction.   LOS: 1 day    Deisy Ozbun PATRICK 08/25/2011

## 2011-08-26 ENCOUNTER — Encounter (HOSPITAL_COMMUNITY): Payer: Self-pay | Admitting: Thoracic Surgery

## 2011-08-26 ENCOUNTER — Inpatient Hospital Stay (HOSPITAL_COMMUNITY): Payer: Medicaid Other

## 2011-08-26 LAB — TYPE AND SCREEN

## 2011-08-26 LAB — CBC
MCH: 33.1 pg (ref 26.0–34.0)
MCHC: 33.8 g/dL (ref 30.0–36.0)
Platelets: 209 10*3/uL (ref 150–400)

## 2011-08-26 LAB — COMPREHENSIVE METABOLIC PANEL
ALT: 11 U/L (ref 0–35)
AST: 20 U/L (ref 0–37)
Calcium: 9 mg/dL (ref 8.4–10.5)
Creatinine, Ser: 0.71 mg/dL (ref 0.50–1.10)
GFR calc Af Amer: >90 mL/min (ref >90)
Sodium: 135 mEq/L (ref 135–145)
Total Protein: 6 g/dL (ref 6.0–8.3)

## 2011-08-26 MED ORDER — TRAMADOL HCL 50 MG PO TABS: 50.0000 mg | ORAL_TABLET | Freq: Four times a day (QID) | ORAL | Status: AC | PRN

## 2011-08-26 MED ORDER — POLYSACCHARIDE IRON COMPLEX 150 MG PO CAPS: 150.0000 mg | ORAL_CAPSULE | Freq: Every day | ORAL | Status: DC

## 2011-08-26 MED ORDER — ALBUTEROL SULFATE HFA 108 (90 BASE) MCG/ACT IN AERS: 4.0000 | INHALATION_SPRAY | Freq: Four times a day (QID) | RESPIRATORY_TRACT | Status: DC

## 2011-08-26 MED ORDER — SODIUM CHLORIDE 0.9 % IJ SOLN
INTRAMUSCULAR | Status: AC
  Administered 2011-08-26: 10 mL
  Filled 2011-08-26: qty 20

## 2011-08-26 NOTE — Discharge Summary (Signed)
Physician Discharge Summary  Patient ID: [name] MRN: [id number] DOB/AGE: 02-08-1981 31 y.o.  Admit date: 08/24/2011 Discharge date: 08/27/2011  Admission Diagnoses: Recurrent left spontaneous pneumothorax  Discharge Diagnoses:  Recurrent left spontaneous pneumothorax   Procedure (s): VIDEO ASSISTED THORACOSCOPY(LEFT); LUL WEDGE RESECTION; MECHANICAL PLEURODESIS/PLEURECTOMY by Dr. Edwyna Shell on 08/24/2011.   History of Presenting Illness: This is a 58 year African American female with a history of left spontaneous pneumothorax requiring a left chest tube in July 2012.She was in her normal state of health until late the evening of 08/15/2011 when she began experiencing sharp, left pleuritic chest pain. This was similar to the pain she had with her previous left spontaneous pneumothorax. She took a Tums as she thought she might have heartburn, but that did not relieve her symptoms.She denied any shortness of breath, fever, chills, hemoptysis, or cough. She presented to [facility] Emergency Room for further evaluation and treatment.Her vital signs are blood pressure is116/62, heart rate 71, O2 Sat 100% on 2 L via [place] and she was no acute distress.A thoracic evaluation was requested. Ultimately, after discussion with the surgeon on call, the patient not require a chest tube on this admission, as her left pneumothorax was less than 15%. A discussion was had with the patient and she wished to go home. She was observed over the next several hours. Her vital signs remained stable and a followup chest x-ray showed a reduction the left pneumothorax approximately 10% . She was then seen by Dr. Edwyna Shell in the office the following week. A discussion was had with the patient regarding the necessitation for a left VATS. Potential risks, complications, and benefits of the surgery discussed with the patient she agreed to proceed. She was admitted to [facility] on 08/24/2011  in order to undergo a left VATS, left upper  lobe wedge resection, mechanical pleurodesis, and partial pleurectomy by Dr. Edwyna Shell.   Brief Hospital Course:  She has remained afebrile and hemodynamic is stable. Daily chest x-rays were obtained. She was not found have an air leak from her chest tube. Her suction was decreased on postop day #1. Her chest tube was then placed to water seal. Her chest x-ray remained stable and she was not found have an air leak. Her chest tube was removed on 08/26/2011. A PA and lateral chest x-ray be obtained in the morning. She's are then tolerating a diet and his insulin without difficulty. Provided her vital signs remain stable as well as her chest x-ray, she will be surgical stable for discharge on 08/27/2011.  Filed Vitals:   08/26/11 0418  BP:   Pulse:   Temp:   Resp: 20     Latest Vital Signs: Blood pressure 110/57, pulse 108, temperature 99.6 F (37.6 C), temperature source Oral, resp. rate 20, height 5\' 9"  (1.753 m), weight 112 lb 10.5 oz (51.1 kg), last menstrual period 08/26/2011, SpO2 100.00%.  Physical Exam: General appearance: alert  Lungs: clear to auscultation bilaterally  Discharge Condition:Stable  Recent laboratory studies:  Lab Results  Component Value Date   WBC 9.8 08/26/2011   HGB 10.0* 08/26/2011   HCT 29.6* 08/26/2011   MCV 98.0 08/26/2011   PLT 209 08/26/2011   Lab Results  Component Value Date   NA 135 08/26/2011   K 3.7 08/26/2011   CL 103 08/26/2011   CO2 25 08/26/2011   CREATININE 0.71 08/26/2011   GLUCOSE 116* 08/26/2011      Diagnostic Studies: Chest 2 View  Dg Chest [place]  1 View  08/26/2011  *RADIOLOGY REPORT*  Clinical Data: Status post VATS  PORTABLE CHEST - 1 VIEW  Comparison: 08/25/2011.  Findings: The support apparatus is stable except one of the left- sided chest tubes has been removed.  No pneumothorax.  Persistent apical pleural fluid collection and stable left apical surgical changes.  Persistent left lower lobe atelectasis and small left effusion.  Right lung remains  clear.  IMPRESSION:  1.  Removal of one of the left-sided chest tubes without pneumothorax. 2.  Stable small left effusion and left lower lobe atelectasis.  Original Report Authenticated By: P. Loralie Champagne, M.D.      Discharge Orders    Future Appointments: Provider: Department: Dept Phone: Center:   09/02/2011 2:15 PM D Karle Plumber, MD [facility] [phone] [facility]      Discharge Medications: Medication List  As of 08/26/2011 10:25 AM   TAKE these medications         albuterol 108 (90 BASE) MCG/ACT inhaler   Commonly known as: PROVENTIL HFA;VENTOLIN HFA   Inhale 4 puffs into the lungs every 6 (six) hours.      iron polysaccharides 150 MG capsule   Commonly known as: NIFEREX   Take 1 capsule (150 mg total) by mouth daily. For one month then stop.      traMADol 50 MG tablet   Commonly known as: ULTRAM   Take 1-2 tablets (50-100 mg total) by mouth every 6 (six) hours as needed for pain.            Follow Up Appointments: Follow-up Information    Follow up with Cameron Proud, MD. (PA/LAT CXR to be taken on 09/02/2011 ar 1:15 pm;Appointment with Dr. Edwyna Shell is on 09/02/2011 at 2:15 pm)    Contact information:   [address] [place] [address] [phone]          Signed: Doree Fudge MPA-C 08/26/2011, 10:25 AM

## 2011-08-26 NOTE — Discharge Instructions (Signed)
ACTIVITY:  1.Increase activity slowly. 2.Walk daily and increase frequency and duration as tolerates. 3.May walk up steps. 4.No lifting more than ten pounds for two weeks. 5.No driving for two weeks. 6.Avoid straining. 7.STOP any activity that causes chest pain, shortness of breath, dizziness,sweating, or    excessive weakness. 8.Continue with breathing exercises daily.  DIET:Regular  WOUND:  1.May shower. 2.Clean wounds with mild soap and water.  Call the office at [phone] if any  problems arise.   Thoracoscopy Care After Refer to this sheet in the next few weeks. These discharge instructions provide you with general information on caring for yourself after you leave the hospital. Your caregiver may also give you specific instructions. Your treatment has been planned according to the most current medical practices available, but unavoidable complications sometimes occur. If you have any problems or questions after discharge, call your caregiver. HOME CARE INSTRUCTIONS   Remove the bandage (dressing) over your chest tube site as directed by your caregiver.   It is normal to be sore for a couple weeks following surgery. See your caregiver if this seems to be getting worse rather than better.   Only take over-the-counter or prescription medicines for pain, discomfort, or fever as directed by your caregiver. It is very important to take pain medicine when you need it so that you will cough and breathe deeply enough to clear mucus (phlegm) and expand your lungs.   If it hurts to cough, hold a pillow against your chest when you cough. This may help with the discomfort. In spite of the discomfort, cough frequently, as this helps protect against getting an infection in your lung (pneumonia).   Taking deep breaths keeps lungs inflated and protects against pneumonia. Most patients will go home with an incentive spirometer that encourages deep breathing.   You may resume a normal diet and  activities as directed.   Use showers for bathing until you see your caregiver, or as instructed.   Change dressings if necessary or as directed.   Avoid lifting or driving until you are instructed otherwise.   Make an appointment to see your caregiver for stitch (suture) or staple removal when instructed.   Do not travel by airplane for 2 weeks after the chest tube is removed.  SEEK MEDICAL CARE IF:   You are bleeding from your wounds.   You have redness, swelling, or increasing pain in the wounds.   Your heartbeat feels irregular or very fast.   There is pus coming from your wounds.   There is a bad smell coming from the wound or dressing.  SEEK IMMEDIATE MEDICAL CARE IF:   You have a fever.   You develop a rash.   You have difficulty breathing.   You develop any reaction or side effects to medicines given.   You develop lightheadedness or feel faint.   You develop shortness of breath or chest pain.  MAKE SURE YOU:   Understand these instructions.   Will watch your condition.   Will get help right away if you are not doing well or get worse.  Document Released: 12/26/2004 Document Revised: 05/28/2011 Document Reviewed: 11/26/2010 [facility] Patient Information 2012 [place], [place].

## 2011-08-26 NOTE — Progress Notes (Signed)
CSW dmet with pt briefly to discuss disposition. Pt states her mother in law will be staying with her to provide care to pt and pt daughter. No other SW needs identified at this time.  Baxter Flattery, MSW [phone]

## 2011-08-26 NOTE — Progress Notes (Signed)
                                              2 Days Post-Op Procedure(s) (LRB): VIDEO ASSISTED THORACOSCOPY (Left) Subjective: The patient has no air leak. Discontinue chest tube and CVP line chest x-ray looks good. Hope to discharge tomorrow.  Objective: Vital signs in last 24 hours: Temp:  [98 F (36.7 C)-100 F (37.8 C)] 99.6 F (37.6 C) (03/06 0345) Pulse Rate:  [92-115] 108  (03/06 0345) Cardiac Rhythm:  [-] Sinus tachycardia (03/06 0345) Resp:  [17-27] 20  (03/06 0418) BP: (108-117)/(56-76) 110/57 mmHg (03/06 0345) SpO2:  [99 %-100 %] 100 % (03/06 0418)  Hemodynamic parameters for last 24 hours:    Intake/Output from previous day: 03/05 0701 - 03/06 0700 In: 460 [P.O.:360; I.V.:100] Out: 3595 [Urine:3480; Chest Tube:115] Intake/Output this shift:    General appearance: alert Lungs: clear to auscultation bilaterally  Lab Results:  Basename 08/26/11 0430 08/25/11 0427  WBC 9.8 7.9  HGB 10.0* 9.9*  HCT 29.6* 29.2*  PLT 209 202   BMET:  Basename 08/26/11 0430 08/25/11 0427  NA 135 139  K 3.7 3.4*  CL 103 110  CO2 25 25  GLUCOSE 116* 118*  BUN 4* 4*  CREATININE 0.71 0.73  CALCIUM 9.0 8.2*    PT/INR: No results found for this basename: LABPROT,INR in the last 72 hours ABG    Component Value Date/Time   PHART 7.400 08/20/2011 1545   HCO3 22.1 08/20/2011 1545   TCO2 23.2 08/20/2011 1545   ACIDBASEDEF 2.0 08/20/2011 1545   O2SAT 97.0 08/20/2011 1545   CBG (last 3)  No results found for this basename: GLUCAP:3 in the last 72 hours  Assessment/Plan: S/P Procedure(s) (LRB): VIDEO ASSISTED THORACOSCOPY (Left) Discontinue chest tube and CVP line plan discharge tomorrow   LOS: 2 days    Terryon Pineiro PATRICK 08/26/2011

## 2011-08-27 ENCOUNTER — Inpatient Hospital Stay (HOSPITAL_COMMUNITY): Payer: Medicaid Other

## 2011-08-27 NOTE — Progress Notes (Addendum)
                    [address]            [place] [address]          [phone]     3 Days Post-Op Procedure(s) (LRB): VIDEO ASSISTED THORACOSCOPY (Left)  Subjective: Feels well, ready to go home.  Objective: Vital signs in last 24 hours: Patient Vitals for the past 24 hrs:  BP Temp Temp src Resp SpO2  08/27/11 0429 111/63 mmHg 99.5 F (37.5 C) Oral 21  98 %  08/27/11 0300 - - - 20  -  08/26/11 2314 107/69 mmHg 99.9 F (37.7 C) Oral 23  99 %  08/26/11 1951 110/71 mmHg 99.4 F (37.4 C) Oral 27  98 %  08/26/11 1619 - 97.8 F (36.6 C) Oral - 98 %  08/26/11 1200 112/67 mmHg 98.2 F (36.8 C) Oral - 97 %  08/26/11 0909 - - - - 100 %   Current Weight  08/24/11 51.1 kg (112 lb 10.5 oz)     Intake/Output from previous day: 03/06 0701 - 03/07 0700 In: 240 [P.O.:240] Out: 300 [Urine:300]    PHYSICAL EXAM:  Heart: RRR, sl tachy 110s Lungs: clear Wound: clean and dry   Lab Results: CBC: Basename 08/26/11 0430 08/25/11 0427  WBC 9.8 7.9  HGB 10.0* 9.9*  HCT 29.6* 29.2*  PLT 209 202   BMET:  Basename 08/26/11 0430 08/25/11 0427  NA 135 139  K 3.7 3.4*  CL 103 110  CO2 25 25  GLUCOSE 116* 118*  BUN 4* 4*  CREATININE 0.71 0.73  CALCIUM 9.0 8.2*    PT/INR: No results found for this basename: LABPROT,INR in the last 72 hours  CXR stable, no ptx  Assessment/Plan: S/P Procedure(s) (LRB): VIDEO ASSISTED THORACOSCOPY (Left) Discharge home today.   LOS: 3 days    COLLINS,GINA H 08/27/2011   Agree with above.Cameron Proud

## 2011-08-27 NOTE — Progress Notes (Signed)
  Discussed discharge instructions and medications with patient and family member. Answered all questions. Vss. Pt has no complaints of pain. Surgical site is WNL. All IV's are dc'ed with tips intact. Pt discharged home with family.  Celesta Gentile 3/7/201311:48 AM

## 2011-09-02 ENCOUNTER — Encounter: Payer: Self-pay | Admitting: Thoracic Surgery

## 2011-09-02 ENCOUNTER — Other Ambulatory Visit: Payer: Self-pay | Admitting: Thoracic Surgery

## 2011-09-02 ENCOUNTER — Ambulatory Visit (INDEPENDENT_AMBULATORY_CARE_PROVIDER_SITE_OTHER): Payer: Self-pay | Admitting: Thoracic Surgery

## 2011-09-02 ENCOUNTER — Ambulatory Visit
Admission: RE | Admit: 2011-09-02 | Discharge: 2011-09-02 | Disposition: A | Discharge status: Home / Self Care | Payer: Medicaid Other | Source: Ambulatory Visit | Attending: Thoracic Surgery | Admitting: Thoracic Surgery

## 2011-09-02 VITALS — BP 90/60 | HR 60 | Resp 16 | Ht 69.0 in | Wt 120.0 lb

## 2011-09-02 DIAGNOSIS — J439 Emphysema, unspecified: Secondary | ICD-10-CM

## 2011-09-02 DIAGNOSIS — J930 Spontaneous tension pneumothorax: Secondary | ICD-10-CM

## 2011-09-02 DIAGNOSIS — Z09 Encounter for follow-up examination after completed treatment for conditions other than malignant neoplasm: Secondary | ICD-10-CM

## 2011-09-02 DIAGNOSIS — Z8709 Personal history of other diseases of the respiratory system: Secondary | ICD-10-CM

## 2011-09-02 NOTE — Progress Notes (Signed)
HPI returns after a apical blebectomy and pleurectomy. Her incisions are healing well removed her chest tube sutures we will released return to work on March 25. Lungs were clear to auscultation percussion. She is doing well overall. We told her she could start driving. We will see her back again in 2 weeks with a chest x-ray   Current Outpatient Prescriptions  Medication Sig Dispense Refill  . albuterol (PROVENTIL HFA;VENTOLIN HFA) 108 (90 BASE) MCG/ACT inhaler Inhale 4 puffs into the lungs every 6 (six) hours.      . traMADol (ULTRAM) 50 MG tablet Take 1-2 tablets (50-100 mg total) by mouth every 6 (six) hours as needed for pain.  45 tablet  0  . iron polysaccharides (NIFEREX) 150 MG capsule Take 1 capsule (150 mg total) by mouth daily. For one month then stop.  30 capsule  0     Review of Systems: Unchanged   Physical Exam lungs are clear to auscultation percussion   Diagnostic Tests: Chest x-ray shows normal postoperative changes  Impression: Recurrent pneumothorax on left secondary apical blebs   Plan: Return in 2 weeks with chest x-ray

## 2011-09-03 ENCOUNTER — Telehealth: Payer: Self-pay | Admitting: *Deleted

## 2011-09-03 NOTE — Telephone Encounter (Signed)
Pt left message stating that she was not able to keep appt for colpo results because she had surgery that Rhyatt Muska. She would like the results by phone if possible.  I returned pt call and informed her that I will send a messge to Dr. Adrian Blackwater and find out if he wants her to have an appt or can receive results by phone. Pt voiced understanding.

## 2011-09-04 NOTE — Telephone Encounter (Signed)
Biopsy showed benign cells.  Will need PAP at 6 months and 12 months after colposcopy was done.

## 2011-09-07 NOTE — Telephone Encounter (Signed)
Called [name] and informed her of Colpo results and recommended follow up. [name] stated that she would like to have her Paps done @ our clinic. She was instructed to call back in June to schedule Pap appt for August. [name] voiced understanding.

## 2011-09-08 ENCOUNTER — Other Ambulatory Visit: Payer: Self-pay | Admitting: Thoracic Surgery

## 2011-09-08 DIAGNOSIS — J930 Spontaneous tension pneumothorax: Secondary | ICD-10-CM

## 2011-09-16 ENCOUNTER — Ambulatory Visit
Admission: RE | Admit: 2011-09-16 | Discharge: 2011-09-16 | Disposition: A | Discharge status: Home / Self Care | Payer: Medicaid Other | Source: Ambulatory Visit | Attending: Thoracic Surgery | Admitting: Thoracic Surgery

## 2011-09-16 ENCOUNTER — Encounter: Payer: Self-pay | Admitting: Thoracic Surgery

## 2011-09-16 ENCOUNTER — Ambulatory Visit (INDEPENDENT_AMBULATORY_CARE_PROVIDER_SITE_OTHER): Payer: Self-pay | Admitting: Thoracic Surgery

## 2011-09-16 VITALS — BP 90/60 | HR 73 | Resp 16 | Ht 69.0 in | Wt 126.0 lb

## 2011-09-16 DIAGNOSIS — J9381 Chronic pneumothorax: Secondary | ICD-10-CM

## 2011-09-16 DIAGNOSIS — Z09 Encounter for follow-up examination after completed treatment for conditions other than malignant neoplasm: Secondary | ICD-10-CM

## 2011-09-16 DIAGNOSIS — J9382 Other air leak: Secondary | ICD-10-CM

## 2011-09-16 DIAGNOSIS — J930 Spontaneous tension pneumothorax: Secondary | ICD-10-CM

## 2011-09-16 NOTE — Progress Notes (Signed)
HPI patient returns for final followup today. Incisions are well-healed. Chest x-ray showed normal postoperative changes. She is returned to work. He is doing well overall I will see her again as needed   Current Outpatient Prescriptions  Medication Sig Dispense Refill  . albuterol (PROVENTIL HFA;VENTOLIN HFA) 108 (90 BASE) MCG/ACT inhaler Inhale 2 puffs into the lungs every 6 (six) hours as needed.         Review of Systems: Unchanged  Physical Exam lungs are clear attestation percussion   Diagnostic Tests: Chest x-ray showed normal postoperative changes   Impression: Status post April bullectomy for recurrent pneumothorax   Plan: Return in

## 2011-12-17 ENCOUNTER — Other Ambulatory Visit: Payer: Self-pay | Admitting: Obstetrics and Gynecology

## 2011-12-17 LAB — OB RESULTS CONSOLE GC/CHLAMYDIA
Chlamydia: NEGATIVE
Gonorrhea: NEGATIVE

## 2011-12-17 LAB — OB RESULTS CONSOLE RPR: RPR: NONREACTIVE

## 2012-04-09 ENCOUNTER — Inpatient Hospital Stay (HOSPITAL_COMMUNITY)
Admission: AD | Admit: 2012-04-09 | Discharge: 2012-04-09 | Disposition: A | Discharge status: Home / Self Care | Payer: Medicaid Other | Source: Ambulatory Visit | Attending: Obstetrics and Gynecology | Admitting: Obstetrics and Gynecology

## 2012-04-09 ENCOUNTER — Encounter (HOSPITAL_COMMUNITY): Payer: Self-pay | Admitting: *Deleted

## 2012-04-09 DIAGNOSIS — N949 Unspecified condition associated with female genital organs and menstrual cycle: Secondary | ICD-10-CM | POA: Insufficient documentation

## 2012-04-09 DIAGNOSIS — O479 False labor, unspecified: Secondary | ICD-10-CM

## 2012-04-09 DIAGNOSIS — O99891 Other specified diseases and conditions complicating pregnancy: Secondary | ICD-10-CM | POA: Insufficient documentation

## 2012-04-09 DIAGNOSIS — R109 Unspecified abdominal pain: Secondary | ICD-10-CM | POA: Insufficient documentation

## 2012-04-09 LAB — URINE MICROSCOPIC-ADD ON

## 2012-04-09 LAB — URINALYSIS, ROUTINE W REFLEX MICROSCOPIC
Bilirubin Urine: NEGATIVE
Glucose, UA: NEGATIVE mg/dL
Hgb urine dipstick: NEGATIVE
Ketones, ur: NEGATIVE mg/dL
Protein, ur: NEGATIVE mg/dL
pH: 6.5 (ref 5.0–8.0)

## 2012-04-09 NOTE — MAU Provider Note (Signed)
History     CSN: [id number]  Arrival date and time: 04/09/12 1958   First Provider Initiated Contact with Patient 04/09/12 2057      Chief Complaint  Patient presents with  . Pelvic Pain  . Abdominal Pain   HPI  Pt is a G2P1001 at 28.1 wk IUP here with report of lower pelvic pain that started for past three days.  Pain is described as stabbing pain, that is intermittent in nature.  Laying down increases pain, as well as sitting for extended periods of time.  No report of vaginal bleeding or leaking of fluid.  +fetal movement.    Past Medical History  Diagnosis Date  . Pneumothorax on left   . Hx of left breast biopsy   . Hx of cervical biopsy   . History of tobacco abuse     Past Surgical History  Procedure Date  . Insertion of left chest tube 01/20/11    Dr Donata Clay  . Biopsy of cervix   . Breast biopsy   . Colposcopy vulva w/ biopsy     Normal results  . Video assisted thoracoscopy 08/24/2011    Procedure: VIDEO ASSISTED THORACOSCOPY;  Surgeon: Norton Blizzard, MD;  [facility]: [facility] OR;  Service: Thoracic;  Laterality: Left;  Left Video assisted thorascopic surgery , RESECTION OF APICIAL BLEB WITH PLEURODESIS    History reviewed. No pertinent family history.  History  Substance Use Topics  . Smoking status: Former Smoker    Types: Cigarettes    Quit date: 11/21/2010  . Smokeless tobacco: Never Used  . Alcohol Use: Yes     occasional    Allergies:  Allergies  Allergen Reactions  . Penicillins Rash    Prescriptions prior to admission  Medication Sig Dispense Refill  . albuterol (PROVENTIL HFA;VENTOLIN HFA) 108 (90 BASE) MCG/ACT inhaler Inhale 2 puffs into the lungs every 6 (six) hours as needed. SOB        Review of Systems  Gastrointestinal: Positive for abdominal pain (lower pelvic).  All other systems reviewed and are negative.   Physical Exam   Blood pressure 110/58, pulse 77, temperature 98.6 F (37 C), temperature source Oral, resp. rate 20,  height 5\' 9"  (1.753 m), weight 61.326 kg (135 lb 3.2 oz), last menstrual period 09/25/2011.  Physical Exam  Constitutional: She is oriented to person, place, and time. She appears well-developed and well-nourished. No distress.  HENT:  Head: Normocephalic.  Neck: Normal range of motion. Neck supple.  Cardiovascular: Normal rate, regular rhythm and normal heart sounds.   Respiratory: Effort normal and breath sounds normal.  GI: Soft. There is tenderness (tender where bony projections of fetal parts palpated).       Fetus palpated in area that hurts (bony projections).  Genitourinary: No bleeding around the vagina. Vaginal discharge (mucusy) found.       Closed/thick/hi  Neurological: She is alert and oriented to person, place, and time.  Skin: Skin is warm and dry.   FHR 130's, +accels, reactive Toco - none  MAU Course  Procedures  Results for orders placed during the hospital encounter of 04/09/12 (from the past 24 hour(s))  URINALYSIS, ROUTINE W REFLEX MICROSCOPIC     Status: Abnormal   Collection Time   04/09/12  8:20 PM      Component Value Range   Color, Urine YELLOW  YELLOW   APPearance CLEAR  CLEAR   Specific Gravity, Urine 1.010  1.005 - 1.030   pH 6.5  5.0 -  8.0   Glucose, UA NEGATIVE  NEGATIVE mg/dL   Hgb urine dipstick NEGATIVE  NEGATIVE   Bilirubin Urine NEGATIVE  NEGATIVE   Ketones, ur NEGATIVE  NEGATIVE mg/dL   Protein, ur NEGATIVE  NEGATIVE mg/dL   Urobilinogen, UA 0.2  0.0 - 1.0 mg/dL   Nitrite NEGATIVE  NEGATIVE   Leukocytes, UA SMALL (*) NEGATIVE  URINE MICROSCOPIC-ADD ON     Status: Normal   Collection Time   04/09/12  8:20 PM      Component Value Range   Squamous Epithelial / LPF RARE  RARE   WBC, UA 0-2  <3 WBC/hpf   Bacteria, UA RARE  RARE   Consulted with Dr. Tilden Dome for discharge home Assessment and Plan  Round Ligament Pain  Plan: DC to home Provided reassurance Reviewed labor precautions.   [facility] 04/09/2012, 8:59 PM

## 2012-04-09 NOTE — MAU Note (Signed)
Pain in lower abd, esp on R and mid lower abd. Hurts to stand, when sit too long, and when lying down. Having a lot more pelvic pressure than usual.

## 2012-05-16 ENCOUNTER — Encounter (HOSPITAL_COMMUNITY): Payer: Self-pay | Admitting: *Deleted

## 2012-05-16 ENCOUNTER — Inpatient Hospital Stay (HOSPITAL_COMMUNITY)
Admission: AD | Admit: 2012-05-16 | Discharge: 2012-05-16 | Disposition: A | Discharge status: Home / Self Care | Payer: Medicaid Other | Source: Ambulatory Visit | Attending: Obstetrics & Gynecology | Admitting: Obstetrics & Gynecology

## 2012-05-16 DIAGNOSIS — O479 False labor, unspecified: Secondary | ICD-10-CM

## 2012-05-16 DIAGNOSIS — O4700 False labor before 37 completed weeks of gestation, unspecified trimester: Secondary | ICD-10-CM | POA: Insufficient documentation

## 2012-05-16 LAB — URINALYSIS, ROUTINE W REFLEX MICROSCOPIC
Glucose, UA: NEGATIVE mg/dL
Nitrite: NEGATIVE
Specific Gravity, Urine: 1.025 (ref 1.005–1.030)
pH: 6.5 (ref 5.0–8.0)

## 2012-05-16 LAB — URINE MICROSCOPIC-ADD ON

## 2012-05-16 NOTE — MAU Note (Signed)
Pt reports contractions since Thursday and today started having sharp pains in her "behind". Pt denies leaking of fluid or vaginal bleeding

## 2012-05-16 NOTE — MAU Provider Note (Signed)
History     CSN: [id number]  Arrival date and time: 05/16/12 2055   First Provider Initiated Contact with Patient 05/16/12 2230      Chief Complaint  Patient presents with  . Labor Eval   HPI This is a 31 y.o. female at [redacted]w[redacted]d who presents with c/o contractions/cramps since Thursday. They would come and go, which is why she did not go in. Has not called the office about them. Denies leaking or bleeding. + fetal movement. No history of PTL with this or last baby.  OB History    Grav Para Term Preterm Abortions TAB SAB Ect Mult Living   2 1 1       1       Past Medical History  Diagnosis Date  . Pneumothorax on left   . Hx of left breast biopsy   . Hx of cervical biopsy   . History of tobacco abuse     Past Surgical History  Procedure Date  . Insertion of left chest tube 01/20/11    Dr Donata Clay  . Biopsy of cervix   . Breast biopsy   . Colposcopy vulva w/ biopsy     Normal results  . Video assisted thoracoscopy 08/24/2011    Procedure: VIDEO ASSISTED THORACOSCOPY;  Surgeon: Norton Blizzard, MD;  Location: [facility] OR;  Service: Thoracic;  Laterality: Left;  Left Video assisted thorascopic surgery , RESECTION OF APICIAL BLEB WITH PLEURODESIS    Family History  Problem Relation Age of Onset  . Other Neg Hx     History  Substance Use Topics  . Smoking status: Former Smoker    Types: Cigarettes    Quit date: 11/21/2010  . Smokeless tobacco: Never Used  . Alcohol Use: Yes     Comment: occasional    Allergies:  Allergies  Allergen Reactions  . Penicillins Rash    Prescriptions prior to admission  Medication Sig Dispense Refill  . albuterol (PROVENTIL HFA;VENTOLIN HFA) 108 (90 BASE) MCG/ACT inhaler Inhale 2 puffs into the lungs every 6 (six) hours as needed. SOB        ROS See HPI  Physical Exam   Blood pressure 104/61, pulse 97, temperature 98.2 F (36.8 C), temperature source Oral, resp. rate 18, height 5\' 9"  (1.753 m), weight 139 lb 6.4 oz (63.231 kg),  last menstrual period 09/25/2011.  Physical Exam  Constitutional: She is oriented to person, place, and time. She appears well-developed and well-nourished. No distress (texting throughout interview).  Cardiovascular: Normal rate.   Respiratory: Effort normal.  GI: Soft. She exhibits no distension and no mass. There is no tenderness. There is no rebound and no guarding.  Genitourinary: Vagina normal and uterus normal. No vaginal discharge found.       Dilation: Closed Effacement (%): Thick Exam by:: Wynelle Bourgeois CNM   Musculoskeletal: Normal range of motion.  Neurological: She is alert and oriented to person, place, and time.  Skin: Skin is warm and dry.  Psychiatric: She has a normal mood and affect.  FHR reassuring Uterine irritability noted   MAU Course  Procedures  Assessment and Plan  A:  SIUP at [redacted]w[redacted]d       Preterm contractions with no cervical change  P:  Discussed with Dr Arlyce Dice       DIscharge home       Recommended push PO fluids      May come to office tomorrow PRN or as scheduled on Wednesday  [facility] 05/16/2012, 10:49 PM

## 2012-05-16 NOTE — MAU Note (Signed)
Pt G2 P1 at 33.3wks having back pain, lower abd pain and tightning.  "Feels like I'm in labor."

## 2012-06-01 LAB — OB RESULTS CONSOLE GBS: GBS: NEGATIVE

## 2012-06-11 ENCOUNTER — Inpatient Hospital Stay (HOSPITAL_COMMUNITY)
Admission: AD | Admit: 2012-06-11 | Discharge: 2012-06-11 | Disposition: A | Discharge status: Home / Self Care | Payer: Medicaid Other | Source: Ambulatory Visit | Attending: Obstetrics and Gynecology | Admitting: Obstetrics and Gynecology

## 2012-06-11 ENCOUNTER — Inpatient Hospital Stay (HOSPITAL_COMMUNITY): Payer: Medicaid Other

## 2012-06-11 ENCOUNTER — Encounter (HOSPITAL_COMMUNITY): Payer: Self-pay

## 2012-06-11 DIAGNOSIS — O9989 Other specified diseases and conditions complicating pregnancy, childbirth and the puerperium: Secondary | ICD-10-CM

## 2012-06-11 DIAGNOSIS — N949 Unspecified condition associated with female genital organs and menstrual cycle: Secondary | ICD-10-CM | POA: Insufficient documentation

## 2012-06-11 DIAGNOSIS — O368190 Decreased fetal movements, unspecified trimester, not applicable or unspecified: Secondary | ICD-10-CM | POA: Insufficient documentation

## 2012-06-11 DIAGNOSIS — O26719 Subluxation of symphysis (pubis) in pregnancy, unspecified trimester: Secondary | ICD-10-CM

## 2012-06-11 DIAGNOSIS — O99891 Other specified diseases and conditions complicating pregnancy: Secondary | ICD-10-CM | POA: Insufficient documentation

## 2012-06-11 LAB — URINALYSIS, ROUTINE W REFLEX MICROSCOPIC
Bilirubin Urine: NEGATIVE
Hgb urine dipstick: NEGATIVE
Ketones, ur: NEGATIVE mg/dL
Nitrite: NEGATIVE
Specific Gravity, Urine: <1.005 — ABNORMAL LOW (ref 1.005–1.030)
Urobilinogen, UA: 0.2 mg/dL (ref 0.0–1.0)

## 2012-06-11 LAB — URINE MICROSCOPIC-ADD ON

## 2012-06-11 MED ORDER — COMFORT FIT MATERNITY SUPP MED MISC: 1.0000 [IU] | Status: DC | PRN

## 2012-06-11 NOTE — MAU Provider Note (Signed)
History     CSN: [id number]  Arrival date and time: 06/11/12 1530   None     Chief Complaint  Patient presents with  . Pelvic Pain   HPI 31 y.o. G2P1001 at [redacted]w[redacted]d with "pain in pelvic bone" x 1-2 weeks, worse with walking. No contractions, bleeding, LOF. Somewhat decreased fetal movement. Uncomplicated prenatal course.   Past Medical History  Diagnosis Date  . Pneumothorax on left   . Hx of left breast biopsy   . Hx of cervical biopsy   . History of tobacco abuse     Past Surgical History  Procedure Date  . Insertion of left chest tube 01/20/11    Dr Donata Clay  . Biopsy of cervix   . Breast biopsy   . Colposcopy vulva w/ biopsy     Normal results  . Video assisted thoracoscopy 08/24/2011    Procedure: VIDEO ASSISTED THORACOSCOPY;  Surgeon: Norton Blizzard, MD;  Location: [facility] OR;  Service: Thoracic;  Laterality: Left;  Left Video assisted thorascopic surgery , RESECTION OF APICIAL BLEB WITH PLEURODESIS    Family History  Problem Relation Age of Onset  . Other Neg Hx     History  Substance Use Topics  . Smoking status: Former Smoker    Types: Cigarettes    Quit date: 11/21/2010  . Smokeless tobacco: Never Used  . Alcohol Use: Yes     Comment: occasional    Allergies:  Allergies  Allergen Reactions  . Penicillins Rash    No prescriptions prior to admission    Review of Systems  Constitutional: Negative.   Respiratory: Negative.   Cardiovascular: Negative.   Gastrointestinal: Negative for nausea, vomiting, abdominal pain, diarrhea and constipation.  Genitourinary: Negative for dysuria, urgency, frequency, hematuria and flank pain.       Negative for vaginal bleeding, cramping/contractions  Musculoskeletal: Negative.   Neurological: Negative.   Psychiatric/Behavioral: Negative.    Physical Exam   Blood pressure 109/58, pulse 93, temperature 97.9 F (36.6 C), temperature source Oral, resp. rate 18, height 5\' 9"  (1.753 m), weight 143 lb 6.4 oz (65.046  kg), last menstrual period 09/25/2011.  Physical Exam  Nursing note and vitals reviewed. Constitutional: She is oriented to person, place, and time. She appears well-developed and well-nourished. No distress.  Cardiovascular: Normal rate.   Respiratory: Effort normal.  GI: Soft. She exhibits no mass. There is no tenderness. There is no rebound and no guarding.  Musculoskeletal: Normal range of motion. She exhibits tenderness (symphysis pubis).  Neurological: She is alert and oriented to person, place, and time.  Skin: Skin is warm and dry.    MAU Course  Procedures  Results for orders placed during the hospital encounter of 06/11/12 (from the past 24 hour(s))  URINALYSIS, ROUTINE W REFLEX MICROSCOPIC     Status: Abnormal   Collection Time   06/11/12  3:45 PM      Component Value Range   Color, Urine YELLOW  YELLOW   APPearance CLEAR  CLEAR   Specific Gravity, Urine <1.005 (*) 1.005 - 1.030   pH 6.5  5.0 - 8.0   Glucose, UA NEGATIVE  NEGATIVE mg/dL   Hgb urine dipstick NEGATIVE  NEGATIVE   Bilirubin Urine NEGATIVE  NEGATIVE   Ketones, ur NEGATIVE  NEGATIVE mg/dL   Protein, ur NEGATIVE  NEGATIVE mg/dL   Urobilinogen, UA 0.2  0.0 - 1.0 mg/dL   Nitrite NEGATIVE  NEGATIVE   Leukocytes, UA TRACE (*) NEGATIVE  URINE MICROSCOPIC-ADD ON  Status: Abnormal   Collection Time   06/11/12  3:45 PM      Component Value Range   Squamous Epithelial / LPF FEW (*) RARE   WBC, UA 0-2  <3 WBC/hpf   RBC / HPF 0-2  <3 RBC/hpf   Bacteria, UA RARE  RARE   NST minimally reactive BPP 6/8 (breathing movements seen, but not sustained for 30 seconds), AFI 12  Assessment and Plan   1. Subluxation of symphysis pubis in pregnancy       Medication List     As of 06/11/2012  5:48 PM    START taking these medications         COMFORT FIT MATERNITY SUPP MED Misc   1 Units by Does not apply route as needed (for pregnancy related pain).          Where to get your medications    These are  the prescriptions that you need to pick up. We sent them to a specific pharmacy, so you will need to go there to get them.   [facility] [address] [place], [place] - [address]    [address] [place] [address]    Phone: [phone]        COMFORT FIT MATERNITY SUPP MED Misc            Follow-up Information    Follow up with Levi Aland, MD. (as scheduled or sooner as needed)    Contact information:   [address] [place] [address] [phone]            [name] 06/11/2012, 4:14 PM

## 2012-06-11 NOTE — MAU Note (Signed)
Patient also reports decreased fetal movement.

## 2012-06-11 NOTE — MAU Note (Signed)
Pt reports had had pelvic pain for the last week. Hard to walk .  fetal movement reported a little less than normal. No discharge of bleeding. No contractions.

## 2012-06-19 ENCOUNTER — Encounter (HOSPITAL_COMMUNITY): Payer: Self-pay | Admitting: Obstetrics and Gynecology

## 2012-06-19 ENCOUNTER — Inpatient Hospital Stay (HOSPITAL_COMMUNITY)
Admission: AD | Admit: 2012-06-19 | Discharge: 2012-06-19 | Disposition: A | Discharge status: Home / Self Care | Payer: Medicaid Other | Source: Ambulatory Visit | Attending: Obstetrics and Gynecology | Admitting: Obstetrics and Gynecology

## 2012-06-19 DIAGNOSIS — O479 False labor, unspecified: Secondary | ICD-10-CM | POA: Insufficient documentation

## 2012-06-19 NOTE — MAU Note (Signed)
Pt reports she has had ctx on and off all night. Denies SROM or bleeding a this time . Good fetal movement reported

## 2012-06-19 NOTE — Progress Notes (Signed)
Dr. Claiborne Billings notified of patient arrival for labor evaluation. Pt's cervix is 1, 50, -3 ballotable. Pt complaints of a milky white discharge, no itching or complaints of leakage of fluid. Plan to keep patient on the monitor for 20 mins, if reactive ok to send patient home with labor precautions.

## 2012-06-22 NOTE — L&D Delivery Note (Signed)
Pt completed the first stage without difficulty. The second stage was brief. She had a SVD of one live black female infant over a small perineal tear in the ROA position. Placenta S/I. EBL-400cc. Baby to NBN.

## 2012-07-01 ENCOUNTER — Inpatient Hospital Stay (HOSPITAL_COMMUNITY)
Admission: AD | Admit: 2012-07-01 | Discharge: 2012-07-01 | Disposition: A | Discharge status: Home / Self Care | Payer: Medicaid Other | Source: Ambulatory Visit | Attending: Obstetrics & Gynecology | Admitting: Obstetrics & Gynecology

## 2012-07-01 ENCOUNTER — Telehealth (HOSPITAL_COMMUNITY): Payer: Self-pay | Admitting: *Deleted

## 2012-07-01 ENCOUNTER — Encounter (HOSPITAL_COMMUNITY): Payer: Self-pay | Admitting: *Deleted

## 2012-07-01 DIAGNOSIS — R109 Unspecified abdominal pain: Secondary | ICD-10-CM | POA: Insufficient documentation

## 2012-07-01 MED ORDER — BUTORPHANOL TARTRATE 1 MG/ML IJ SOLN
1.0000 mg | Freq: Once | INTRAMUSCULAR | Status: AC
  Administered 2012-07-01: 1 mg via INTRAMUSCULAR
  Filled 2012-07-01: qty 1

## 2012-07-01 MED ORDER — ZOLPIDEM TARTRATE 5 MG PO TABS
5.0000 mg | ORAL_TABLET | Freq: Once | ORAL | Status: AC
  Administered 2012-07-01: 5 mg via ORAL
  Filled 2012-07-01: qty 1

## 2012-07-01 NOTE — Telephone Encounter (Signed)
Preadmission screen  

## 2012-07-01 NOTE — MAU Note (Signed)
States has severe pain in lower abdomen X 2 wks. MD aware and advised that she soak in warm water. Still feels pain, worse today, "can't hardly walk."

## 2012-07-04 ENCOUNTER — Encounter (HOSPITAL_COMMUNITY): Payer: Self-pay | Admitting: *Deleted

## 2012-07-04 ENCOUNTER — Encounter (HOSPITAL_COMMUNITY): Payer: Self-pay | Admitting: Anesthesiology

## 2012-07-04 ENCOUNTER — Inpatient Hospital Stay (HOSPITAL_COMMUNITY): Payer: Medicaid Other | Admitting: Anesthesiology

## 2012-07-04 ENCOUNTER — Inpatient Hospital Stay (HOSPITAL_COMMUNITY)
Admission: AD | Admit: 2012-07-04 | Discharge: 2012-07-06 | DRG: 767 | Disposition: A | Discharge status: Home / Self Care | Payer: Medicaid Other | Source: Ambulatory Visit | Attending: Obstetrics and Gynecology | Admitting: Obstetrics and Gynecology

## 2012-07-04 DIAGNOSIS — Z302 Encounter for sterilization: Secondary | ICD-10-CM

## 2012-07-04 LAB — CBC
MCH: 28.2 pg (ref 26.0–34.0)
MCHC: 32 g/dL (ref 30.0–36.0)
MCV: 87.9 fL (ref 78.0–100.0)
Platelets: 275 10*3/uL (ref 150–400)
RBC: 3.23 MIL/uL — ABNORMAL LOW (ref 3.87–5.11)

## 2012-07-04 LAB — RPR: RPR Ser Ql: NONREACTIVE

## 2012-07-04 MED ORDER — PHENYLEPHRINE 40 MCG/ML (10ML) SYRINGE FOR IV PUSH (FOR BLOOD PRESSURE SUPPORT)
80.0000 ug | PREFILLED_SYRINGE | INTRAVENOUS | Status: DC | PRN
  Filled 2012-07-04: qty 5

## 2012-07-04 MED ORDER — MEASLES, MUMPS & RUBELLA VAC ~~LOC~~ INJ: 0.5000 mL | INJECTION | Freq: Once | SUBCUTANEOUS | Status: DC

## 2012-07-04 MED ORDER — LIDOCAINE HCL (PF) 1 % IJ SOLN
30.0000 mL | INTRAMUSCULAR | Status: DC | PRN
  Filled 2012-07-04: qty 30

## 2012-07-04 MED ORDER — LACTATED RINGERS IV SOLN
INTRAVENOUS | Status: DC
  Administered 2012-07-05: 05:00:00 via INTRAVENOUS

## 2012-07-04 MED ORDER — LACTATED RINGERS IV SOLN
INTRAVENOUS | Status: DC
  Administered 2012-07-04 (×2): via INTRAVENOUS

## 2012-07-04 MED ORDER — CITRIC ACID-SODIUM CITRATE 334-500 MG/5ML PO SOLN: 30.0000 mL | ORAL | Status: DC | PRN

## 2012-07-04 MED ORDER — OXYTOCIN 40 UNITS IN LACTATED RINGERS INFUSION - SIMPLE MED
62.5000 mL/h | INTRAVENOUS | Status: DC
  Administered 2012-07-04: 62.5 mL/h via INTRAVENOUS
  Filled 2012-07-04: qty 1000

## 2012-07-04 MED ORDER — DIBUCAINE 1 % RE OINT
1.0000 "application " | TOPICAL_OINTMENT | RECTAL | Status: DC | PRN
  Filled 2012-07-04: qty 28

## 2012-07-04 MED ORDER — WITCH HAZEL-GLYCERIN EX PADS: 1.0000 "application " | MEDICATED_PAD | CUTANEOUS | Status: DC | PRN

## 2012-07-04 MED ORDER — TETANUS-DIPHTH-ACELL PERTUSSIS 5-2.5-18.5 LF-MCG/0.5 IM SUSP
0.5000 mL | Freq: Once | INTRAMUSCULAR | Status: DC
  Filled 2012-07-04: qty 0.5

## 2012-07-04 MED ORDER — METOCLOPRAMIDE HCL 10 MG PO TABS
10.0000 mg | ORAL_TABLET | Freq: Once | ORAL | Status: AC
  Administered 2012-07-05: 10 mg via ORAL
  Filled 2012-07-04: qty 1

## 2012-07-04 MED ORDER — ONDANSETRON HCL 4 MG/2ML IJ SOLN: 4.0000 mg | INTRAMUSCULAR | Status: DC | PRN

## 2012-07-04 MED ORDER — LACTATED RINGERS IV SOLN: 500.0000 mL | Freq: Once | INTRAVENOUS | Status: DC

## 2012-07-04 MED ORDER — OXYCODONE-ACETAMINOPHEN 5-325 MG PO TABS
1.0000 | ORAL_TABLET | ORAL | Status: DC | PRN
  Administered 2012-07-05 (×2): 1 via ORAL
  Filled 2012-07-04 (×2): qty 1

## 2012-07-04 MED ORDER — ACETAMINOPHEN 325 MG PO TABS: 650.0000 mg | ORAL_TABLET | ORAL | Status: DC | PRN

## 2012-07-04 MED ORDER — IBUPROFEN 600 MG PO TABS
600.0000 mg | ORAL_TABLET | Freq: Four times a day (QID) | ORAL | Status: DC
  Administered 2012-07-05 – 2012-07-06 (×6): 600 mg via ORAL
  Filled 2012-07-04 (×7): qty 1

## 2012-07-04 MED ORDER — BENZOCAINE-MENTHOL 20-0.5 % EX AERO
1.0000 "application " | INHALATION_SPRAY | CUTANEOUS | Status: DC | PRN
  Administered 2012-07-05: 1 via TOPICAL
  Filled 2012-07-04 (×2): qty 56

## 2012-07-04 MED ORDER — ONDANSETRON HCL 4 MG PO TABS: 4.0000 mg | ORAL_TABLET | ORAL | Status: DC | PRN

## 2012-07-04 MED ORDER — ZOLPIDEM TARTRATE 5 MG PO TABS: 5.0000 mg | ORAL_TABLET | Freq: Every evening | ORAL | Status: DC | PRN

## 2012-07-04 MED ORDER — PHENYLEPHRINE 40 MCG/ML (10ML) SYRINGE FOR IV PUSH (FOR BLOOD PRESSURE SUPPORT): 80.0000 ug | PREFILLED_SYRINGE | INTRAVENOUS | Status: DC | PRN

## 2012-07-04 MED ORDER — DIPHENHYDRAMINE HCL 50 MG/ML IJ SOLN: 12.5000 mg | INTRAMUSCULAR | Status: DC | PRN

## 2012-07-04 MED ORDER — SIMETHICONE 80 MG PO CHEW: 80.0000 mg | CHEWABLE_TABLET | ORAL | Status: DC | PRN

## 2012-07-04 MED ORDER — LACTATED RINGERS IV SOLN: 500.0000 mL | INTRAVENOUS | Status: DC | PRN

## 2012-07-04 MED ORDER — ONDANSETRON HCL 4 MG/2ML IJ SOLN
4.0000 mg | Freq: Four times a day (QID) | INTRAMUSCULAR | Status: DC | PRN
  Administered 2012-07-04: 4 mg via INTRAVENOUS
  Filled 2012-07-04: qty 2

## 2012-07-04 MED ORDER — FAMOTIDINE 20 MG PO TABS
40.0000 mg | ORAL_TABLET | Freq: Once | ORAL | Status: AC
  Administered 2012-07-05: 40 mg via ORAL
  Filled 2012-07-04: qty 1

## 2012-07-04 MED ORDER — OXYCODONE-ACETAMINOPHEN 5-325 MG PO TABS: 1.0000 | ORAL_TABLET | ORAL | Status: DC | PRN

## 2012-07-04 MED ORDER — LIDOCAINE HCL (PF) 1 % IJ SOLN
INTRAMUSCULAR | Status: DC | PRN
  Administered 2012-07-04 (×4): 4 mL

## 2012-07-04 MED ORDER — OXYTOCIN BOLUS FROM INFUSION: 500.0000 mL | INTRAVENOUS | Status: DC

## 2012-07-04 MED ORDER — EPHEDRINE 5 MG/ML INJ: 10.0000 mg | INTRAVENOUS | Status: DC | PRN

## 2012-07-04 MED ORDER — FLEET ENEMA 7-19 GM/118ML RE ENEM: 1.0000 | ENEMA | RECTAL | Status: DC | PRN

## 2012-07-04 MED ORDER — IBUPROFEN 600 MG PO TABS
600.0000 mg | ORAL_TABLET | Freq: Four times a day (QID) | ORAL | Status: DC | PRN
  Administered 2012-07-04: 600 mg via ORAL
  Filled 2012-07-04: qty 1

## 2012-07-04 MED ORDER — EPHEDRINE 5 MG/ML INJ
10.0000 mg | INTRAVENOUS | Status: DC | PRN
  Filled 2012-07-04: qty 4

## 2012-07-04 MED ORDER — FENTANYL 2.5 MCG/ML BUPIVACAINE 1/10 % EPIDURAL INFUSION (WH - ANES)
14.0000 mL/h | INTRAMUSCULAR | Status: DC
  Administered 2012-07-04: 14 mL/h via EPIDURAL
  Filled 2012-07-04: qty 125

## 2012-07-04 NOTE — Anesthesia Procedure Notes (Signed)
Epidural Patient location during procedure: OB Start time: 07/04/2012 1:35 PM  Staffing Performed by: anesthesiologist   Preanesthetic Checklist Completed: patient identified, site marked, surgical consent, pre-op evaluation, timeout performed, IV checked, risks and benefits discussed and monitors and equipment checked  Epidural Patient position: sitting Prep: site prepped and draped and DuraPrep Patient monitoring: continuous pulse ox and blood pressure Approach: midline Injection technique: LOR air  Needle:  Needle type: Tuohy  Needle gauge: 17 G Needle length: 9 cm and 9 Needle insertion depth: 4.5 cm Catheter type: closed end flexible Catheter size: 19 Gauge Catheter at skin depth: 9.5 cm Test dose: negative  Assessment Events: blood not aspirated, injection not painful, no injection resistance, negative IV test and no paresthesia  Additional Notes Discussed risk of headache, infection, bleeding, nerve injury and failed or incomplete block.  Patient voices understanding and wishes to proceed.  Epidural placed easily on first attempt.  Patient tolerated procedure well with no apparent complications.  Jasmine December MD Reason for block:procedure for pain

## 2012-07-04 NOTE — H&P (Signed)
Pt is a 32 year old black female, G2P1001, at term who presents to L&D in labor. On admission pt was 3 cm. Pt's PNC was uncomplicated. GBS- PMHX: see hollister PE: VSSAF         HEENT- wnl         Abd-soft, gravid         FHTs- reactive IMP/ IUP at term PLAN/ Admit

## 2012-07-04 NOTE — Anesthesia Preprocedure Evaluation (Addendum)
Anesthesia Evaluation  Patient identified by MRN, date of birth, ID band Patient awake    Reviewed: Allergy & Precautions, H&P , NPO status , Patient's Chart, lab work & pertinent test results, reviewed documented beta blocker date and time   History of Anesthesia Complications Negative for: history of anesthetic complications  Airway Mallampati: I TM Distance: >3 FB Neck ROM: full    Dental No notable dental hx. (+) Teeth Intact   Pulmonary former smoker (quit 2012),  H/p ptx. Had VATS to evaluate in 3/13 - found cysts on lung which were removed breath sounds clear to auscultation  Pulmonary exam normal       Cardiovascular negative cardio ROS  Rhythm:regular Rate:Normal     Neuro/Psych negative neurological ROS  negative psych ROS   GI/Hepatic negative GI ROS, Neg liver ROS,   Endo/Other  negative endocrine ROS  Renal/GU negative Renal ROS     Musculoskeletal   Abdominal Normal abdominal exam  (+)   Peds  Hematology negative hematology ROS (+)   Anesthesia Other Findings   Reproductive/Obstetrics Desires Sterilization                          Anesthesia Physical  Anesthesia Plan  ASA: II  Anesthesia Plan: Epidural   Post-op Pain Management:    Induction: Intravenous  Airway Management Planned:   Additional Equipment:   Intra-op Plan:   Post-operative Plan:   Informed Consent: I have reviewed the patients History and Physical, chart, labs and discussed the procedure including the risks, benefits and alternatives for the proposed anesthesia with the patient or authorized representative who has indicated his/her understanding and acceptance.     Plan Discussed with: Anesthesiologist, CRNA and Surgeon  Anesthesia Plan Comments:        Anesthesia Quick Evaluation

## 2012-07-04 NOTE — Anesthesia Postprocedure Evaluation (Signed)
  Anesthesia Post-op Note  Patient: [name]  Procedure(s) Performed: * No procedures listed *  Patient Location: PACU  Anesthesia Type:Epidural  Level of Consciousness: awake, alert  and oriented  Airway and Oxygen Therapy: Patient Spontanous Breathing  Post-op Pain: none  Post-op Assessment: Post-op Vital signs reviewed, Patient's Cardiovascular Status Stable, Respiratory Function Stable, Patent Airway, No signs of Nausea or vomiting, Pain level controlled, No headache, No backache, No residual numbness and No residual motor weakness  Post-op Vital Signs: Reviewed and stable  Complications: No apparent anesthesia complications

## 2012-07-04 NOTE — Anesthesia Preprocedure Evaluation (Signed)
Anesthesia Evaluation  Patient identified by MRN, date of birth, ID band Patient awake    Reviewed: Allergy & Precautions, H&P , NPO status , Patient's Chart, lab work & pertinent test results, reviewed documented beta blocker date and time   History of Anesthesia Complications Negative for: history of anesthetic complications  Airway Mallampati: I TM Distance: >3 FB Neck ROM: full    Dental  (+) Teeth Intact   Pulmonary former smoker (quit 2012),  H/p ptx. Had VATS to evaluate in 3/13 - found cysts on lung which were removed breath sounds clear to auscultation        Cardiovascular negative cardio ROS  Rhythm:regular Rate:Normal     Neuro/Psych negative neurological ROS  negative psych ROS   GI/Hepatic negative GI ROS, Neg liver ROS,   Endo/Other  negative endocrine ROS  Renal/GU negative Renal ROS     Musculoskeletal   Abdominal   Peds  Hematology negative hematology ROS (+) anemia ,   Anesthesia Other Findings   Reproductive/Obstetrics (+) Pregnancy                           Anesthesia Physical Anesthesia Plan  ASA: II  Anesthesia Plan: Epidural   Post-op Pain Management:    Induction:   Airway Management Planned:   Additional Equipment:   Intra-op Plan:   Post-operative Plan:   Informed Consent: I have reviewed the patients History and Physical, chart, labs and discussed the procedure including the risks, benefits and alternatives for the proposed anesthesia with the patient or authorized representative who has indicated his/her understanding and acceptance.     Plan Discussed with:   Anesthesia Plan Comments:         Anesthesia Quick Evaluation

## 2012-07-05 ENCOUNTER — Encounter (HOSPITAL_COMMUNITY): Payer: Self-pay | Admitting: *Deleted

## 2012-07-05 ENCOUNTER — Encounter (HOSPITAL_COMMUNITY): Payer: Self-pay | Admitting: Anesthesiology

## 2012-07-05 ENCOUNTER — Encounter (HOSPITAL_COMMUNITY)
Admission: AD | Disposition: A | Discharge status: Home / Self Care | Payer: Self-pay | Source: Ambulatory Visit | Attending: Obstetrics and Gynecology

## 2012-07-05 ENCOUNTER — Inpatient Hospital Stay (HOSPITAL_COMMUNITY): Payer: Medicaid Other | Admitting: Anesthesiology

## 2012-07-05 HISTORY — PX: TUBAL LIGATION: SHX77

## 2012-07-05 LAB — CBC
HCT: 25.8 % — ABNORMAL LOW (ref 36.0–46.0)
Hemoglobin: 8.4 g/dL — ABNORMAL LOW (ref 12.0–15.0)
MCH: 28.4 pg (ref 26.0–34.0)
MCHC: 32.6 g/dL (ref 30.0–36.0)
MCV: 87.2 fL (ref 78.0–100.0)

## 2012-07-05 SURGERY — LIGATION, FALLOPIAN TUBE, POSTPARTUM
Anesthesia: Epidural | Site: Abdomen | Laterality: Bilateral

## 2012-07-05 MED ORDER — MIDAZOLAM HCL 2 MG/2ML IJ SOLN
INTRAMUSCULAR | Status: AC
  Filled 2012-07-05: qty 2

## 2012-07-05 MED ORDER — FENTANYL CITRATE 0.05 MG/ML IJ SOLN
INTRAMUSCULAR | Status: AC
  Administered 2012-07-05: 50 ug via INTRAVENOUS
  Filled 2012-07-05: qty 2

## 2012-07-05 MED ORDER — METOCLOPRAMIDE HCL 5 MG/ML IJ SOLN: 10.0000 mg | Freq: Once | INTRAMUSCULAR | Status: DC | PRN

## 2012-07-05 MED ORDER — MIDAZOLAM HCL 5 MG/5ML IJ SOLN
INTRAMUSCULAR | Status: DC | PRN
  Administered 2012-07-05: 2 mg via INTRAVENOUS

## 2012-07-05 MED ORDER — ONDANSETRON HCL 4 MG/2ML IJ SOLN
INTRAMUSCULAR | Status: DC | PRN
  Administered 2012-07-05: 4 mg via INTRAVENOUS

## 2012-07-05 MED ORDER — FENTANYL CITRATE 0.05 MG/ML IJ SOLN
25.0000 ug | INTRAMUSCULAR | Status: DC | PRN
  Administered 2012-07-05 (×2): 50 ug via INTRAVENOUS

## 2012-07-05 MED ORDER — MEPERIDINE HCL 25 MG/ML IJ SOLN: 6.2500 mg | INTRAMUSCULAR | Status: DC | PRN

## 2012-07-05 MED ORDER — LIDOCAINE HCL (CARDIAC) 20 MG/ML IV SOLN
INTRAVENOUS | Status: DC | PRN
  Administered 2012-07-05: 20 mg via INTRAVENOUS

## 2012-07-05 MED ORDER — LIDOCAINE HCL (CARDIAC) 20 MG/ML IV SOLN
INTRAVENOUS | Status: AC
  Filled 2012-07-05: qty 5

## 2012-07-05 MED ORDER — SODIUM BICARBONATE 8.4 % IV SOLN
INTRAVENOUS | Status: DC | PRN
  Administered 2012-07-05: 3 mL via EPIDURAL

## 2012-07-05 SURGICAL SUPPLY — 20 items
ADH SKN CLS APL DERMABOND .7 (GAUZE/BANDAGES/DRESSINGS) ×1
CLIP FILSHIE TUBAL LIGA STRL (Clip) ×4 IMPLANT
CLOTH BEACON ORANGE TIMEOUT ST (SAFETY) ×2 IMPLANT
CONTAINER PREFILL 10% NBF 15ML (MISCELLANEOUS) ×2 IMPLANT
DERMABOND ADVANCED (GAUZE/BANDAGES/DRESSINGS) ×1
DERMABOND ADVANCED .7 DNX12 (GAUZE/BANDAGES/DRESSINGS) IMPLANT
DRSG COVADERM PLUS 2X2 (GAUZE/BANDAGES/DRESSINGS) ×1 IMPLANT
GLOVE ECLIPSE 6.0 STRL STRAW (GLOVE) ×2 IMPLANT
GLOVE ECLIPSE 6.5 STRL STRAW (GLOVE) ×2 IMPLANT
GOWN PREVENTION PLUS LG XLONG (DISPOSABLE) ×5 IMPLANT
NS IRRIG 1000ML POUR BTL (IV SOLUTION) ×2 IMPLANT
PACK ABDOMINAL MINOR (CUSTOM PROCEDURE TRAY) ×2 IMPLANT
SPONGE LAP 4X18 X RAY DECT (DISPOSABLE) IMPLANT
SUT VIC AB 3-0 SH 27 (SUTURE)
SUT VIC AB 3-0 SH 27X BRD (SUTURE) IMPLANT
SUT VICRYL 0 UR6 27IN ABS (SUTURE) ×2 IMPLANT
SUT VICRYL 4-0 PS2 18IN ABS (SUTURE) ×2 IMPLANT
TOWEL OR 17X24 6PK STRL BLUE (TOWEL DISPOSABLE) ×4 IMPLANT
TRAY FOLEY CATH 14FR (SET/KITS/TRAYS/PACK) ×2 IMPLANT
WATER STERILE IRR 1000ML POUR (IV SOLUTION) ×1 IMPLANT

## 2012-07-05 NOTE — Op Note (Signed)
Mickel Baas Physician Signed Obstetrics Op Note 03/24/2011 1:37 PM     Patient Name: [name]  MRN: [id number]  Date of Surgery: 07/05/2012    PREOPERATIVE DIAGNOSIS: Desires sterilization  POSTOPERATIVE DIAGNOSIS: Desires sterilization   PROCEDURE: Post Partum Tubal Occlusion  SURGEON: Caralyn Guile. Arlyce Dice M.D.  ANESTHESIA: Epidural  ESTIMATED BLOOD LOSS: Minimal  FINDINGS: Normal tubes   INDICATIONS: Patient requests permanent sterilization procedure.  PROCEDURE IN DETAIL: The abdomen was prepped and draped in sterile fashion and the bladder was emptied. A 3 cm subumbilical incision was made and carried down to the peritoneal cavity. The right fallopian tube was grasped with a Babcock clamp and traced to the fimbriated end. The isthmic-ampullary junction was elevated and a Filshie Clip was applied to occlude the tube. The identical procedure was carried out on the left fallopian tube. The fascia was closed with running 0-Vicryl suture. The skin was closed with 4-0 vicryl in a subcuticular fashion. The procedure was then terminated and the patient left the operating room in good condition.

## 2012-07-05 NOTE — Clinical Social Work Psychosocial (Signed)
    Clinical Social Work Department BRIEF PSYCHOSOCIAL ASSESSMENT 07/05/2012  Patient:  [name], [name]     Account Number:  [id number]     Admit date:  07/04/2012  Clinical Social Worker:  Melene Plan  Date/Time:  07/05/2012 12:19 PM  Referred by:  Physician  Date Referred:  07/05/2012 Referred for  Behavioral Health Issues   Other Referral:   Hx of PP depression   Interview type:  Patient Other interview type:    PSYCHOSOCIAL DATA Living Status:  WITH MINOR CHILDREN Admitted from facility:   Level of care:   Primary support name:  Ms. 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  Pt told CSW that she felt anxious and stressed at that time.  Pt was prescribed Zoloft, of which she took briefly, before she  stopped due to unwanted side effects.  Pt's symptoms last for about 1-2 months before they resolved.  She did not participate in counseling but had good family support.  She denies any SI/HI history. Pt has not experienced any depression symptoms since then and reports feeling fine now.  Pt has all the necessary supplies for the infant and good family support.  CSW discussed PP depression symptoms with pt and encouraged her to seek medical attention if needed.  Pt appears appropriate at this time.  CSW available to assist further if needed.   Assessment/plan status:  No Further Intervention Required Other assessment/ plan:   Information/referral to community resources:   PP depression literature given.    PATIENT'S/FAMILY'S RESPONSE TO PLAN OF CARE: Pt thanked CSW for [employer].

## 2012-07-05 NOTE — Anesthesia Postprocedure Evaluation (Signed)
Anesthesia Post Note  Patient: [name]  Procedure(s) Performed: Procedure(s) (LRB): POST PARTUM TUBAL LIGATION (Bilateral)  Anesthesia type: Epidural  Patient location: Mother/Baby  Post pain: Pain level controlled  Post assessment: Post-op Vital signs reviewed  Last Vitals:  Filed Vitals:   07/05/12 1100  BP: 114/76  Pulse: 77  Temp: 36.6 C  Resp: 18    Post vital signs: Reviewed  Level of consciousness:alert  Complications: No apparent anesthesia complications

## 2012-07-05 NOTE — Progress Notes (Signed)
I have interviewed and performed the pertinent exams on my patient to confirm that there have been no significant changes in her condition since the dictation of her history and physical exam.  

## 2012-07-05 NOTE — Anesthesia Postprocedure Evaluation (Signed)
Anesthesia Post Note  Patient: [name]  Procedure(s) Performed: Procedure(s) (LRB): POST PARTUM TUBAL LIGATION (Bilateral)  Anesthesia type: Epidural  Patient location: PACU  Post pain: Pain level controlled  Post assessment: Post-op Vital signs reviewed  Last Vitals:  Filed Vitals:   07/05/12 0930  BP: 107/71  Pulse: 84  Temp: 37.2 C  Resp: 17    Post vital signs: stable  Level of consciousness: awake  Complications: No apparent anesthesia complications

## 2012-07-05 NOTE — Progress Notes (Signed)
Patient reports having had tdap within past year.  No record in prenatal chart.

## 2012-07-05 NOTE — Transfer of Care (Signed)
Immediate Anesthesia Transfer of Care Note  Patient: [name]  Procedure(s) Performed: Procedure(s) (LRB) with comments: POST PARTUM TUBAL LIGATION (Bilateral)  Patient Location: PACU  Anesthesia Type:Epidural  Level of Consciousness: awake  Airway & Oxygen Therapy: Patient Spontanous Breathing  Post-op Assessment: Report given to PACU RN and Post -op Vital signs reviewed and stable  Post vital signs: Reviewed and stable  Complications: No apparent anesthesia complications

## 2012-07-05 NOTE — Addendum Note (Signed)
Addendum  created 07/05/12 1429 by Algis Greenhouse, CRNA   Modules edited:Notes Section

## 2012-07-05 NOTE — Progress Notes (Signed)
Ur chart review completed.  

## 2012-07-06 ENCOUNTER — Encounter (HOSPITAL_COMMUNITY): Payer: Self-pay | Admitting: Obstetrics & Gynecology

## 2012-07-06 NOTE — Progress Notes (Signed)
Patient is eating, ambulating, voiding.  Pain control is good.  Filed Vitals:   07/05/12 1847 07/05/12 2114 07/06/12 0211 07/06/12 0512  BP: 115/70 116/72 101/60 113/69  Pulse: 86 79 69 72  Temp: 97.8 F (36.6 C) 97.6 F (36.4 C) 98.9 F (37.2 C) 97.9 F (36.6 C)  TempSrc: Oral Oral Oral Oral  Resp: 18 18 18 18   Height:      Weight:      SpO2:       Incision intact and without erythema Fundus firm Perineum without swelling.  Lab Results  Component Value Date   WBC 11.6* 07/05/2012   HGB 8.4* 07/05/2012   HCT 25.8* 07/05/2012   MCV 87.2 07/05/2012   PLT 235 07/05/2012    A/Positive/-- (06/27 0000)/RI  A/P Post partum day 2/ POD BTL day 1.  Routine care.  Expect d/c today.    Jelesa Mangini A

## 2012-07-06 NOTE — Discharge Summary (Signed)
Obstetric Discharge Summary Reason for Admission: onset of labor Prenatal Procedures: none Intrapartum Procedures: spontaneous vaginal delivery Postpartum Procedures: P.P. tubal ligation Complications-Operative and Postpartum: first degree perineal laceration Hemoglobin  Date Value Range Status  07/05/2012 8.4* 12.0 - 15.0 g/dL Final     HCT  Date Value Range Status  07/05/2012 25.8* 36.0 - 46.0 % Final    Discharge Diagnoses: Term Pregnancy-delivered  Discharge Information: Date: 07/06/2012 Activity: pelvic rest Diet: routine Medications: Ibuprofen, iron Condition: stable Instructions: refer to practice specific booklet Discharge to: home Follow-up Information    Follow up with Levi Aland, MD. In 4 weeks.   Contact information:   [address] [place] [address] [phone]          Newborn Data: Live born female  Birth Weight: 7 lb 6.9 oz (3370 g) APGAR: 9, 9  Home with mother.  [name] A 07/06/2012, 8:01 AM

## 2012-07-07 ENCOUNTER — Inpatient Hospital Stay (HOSPITAL_COMMUNITY): Admission: RE | Admit: 2012-07-07 | Payer: Medicaid Other | Source: Ambulatory Visit

## 2012-07-27 IMAGING — CR DG CHEST 2V
2 series · 2 of 2 positions shown · non-contrast
Comparison: 09/02/2011

CLINICAL DATA: Status post pneumothorax.

CHEST - 2 VIEW

[w chest pa]
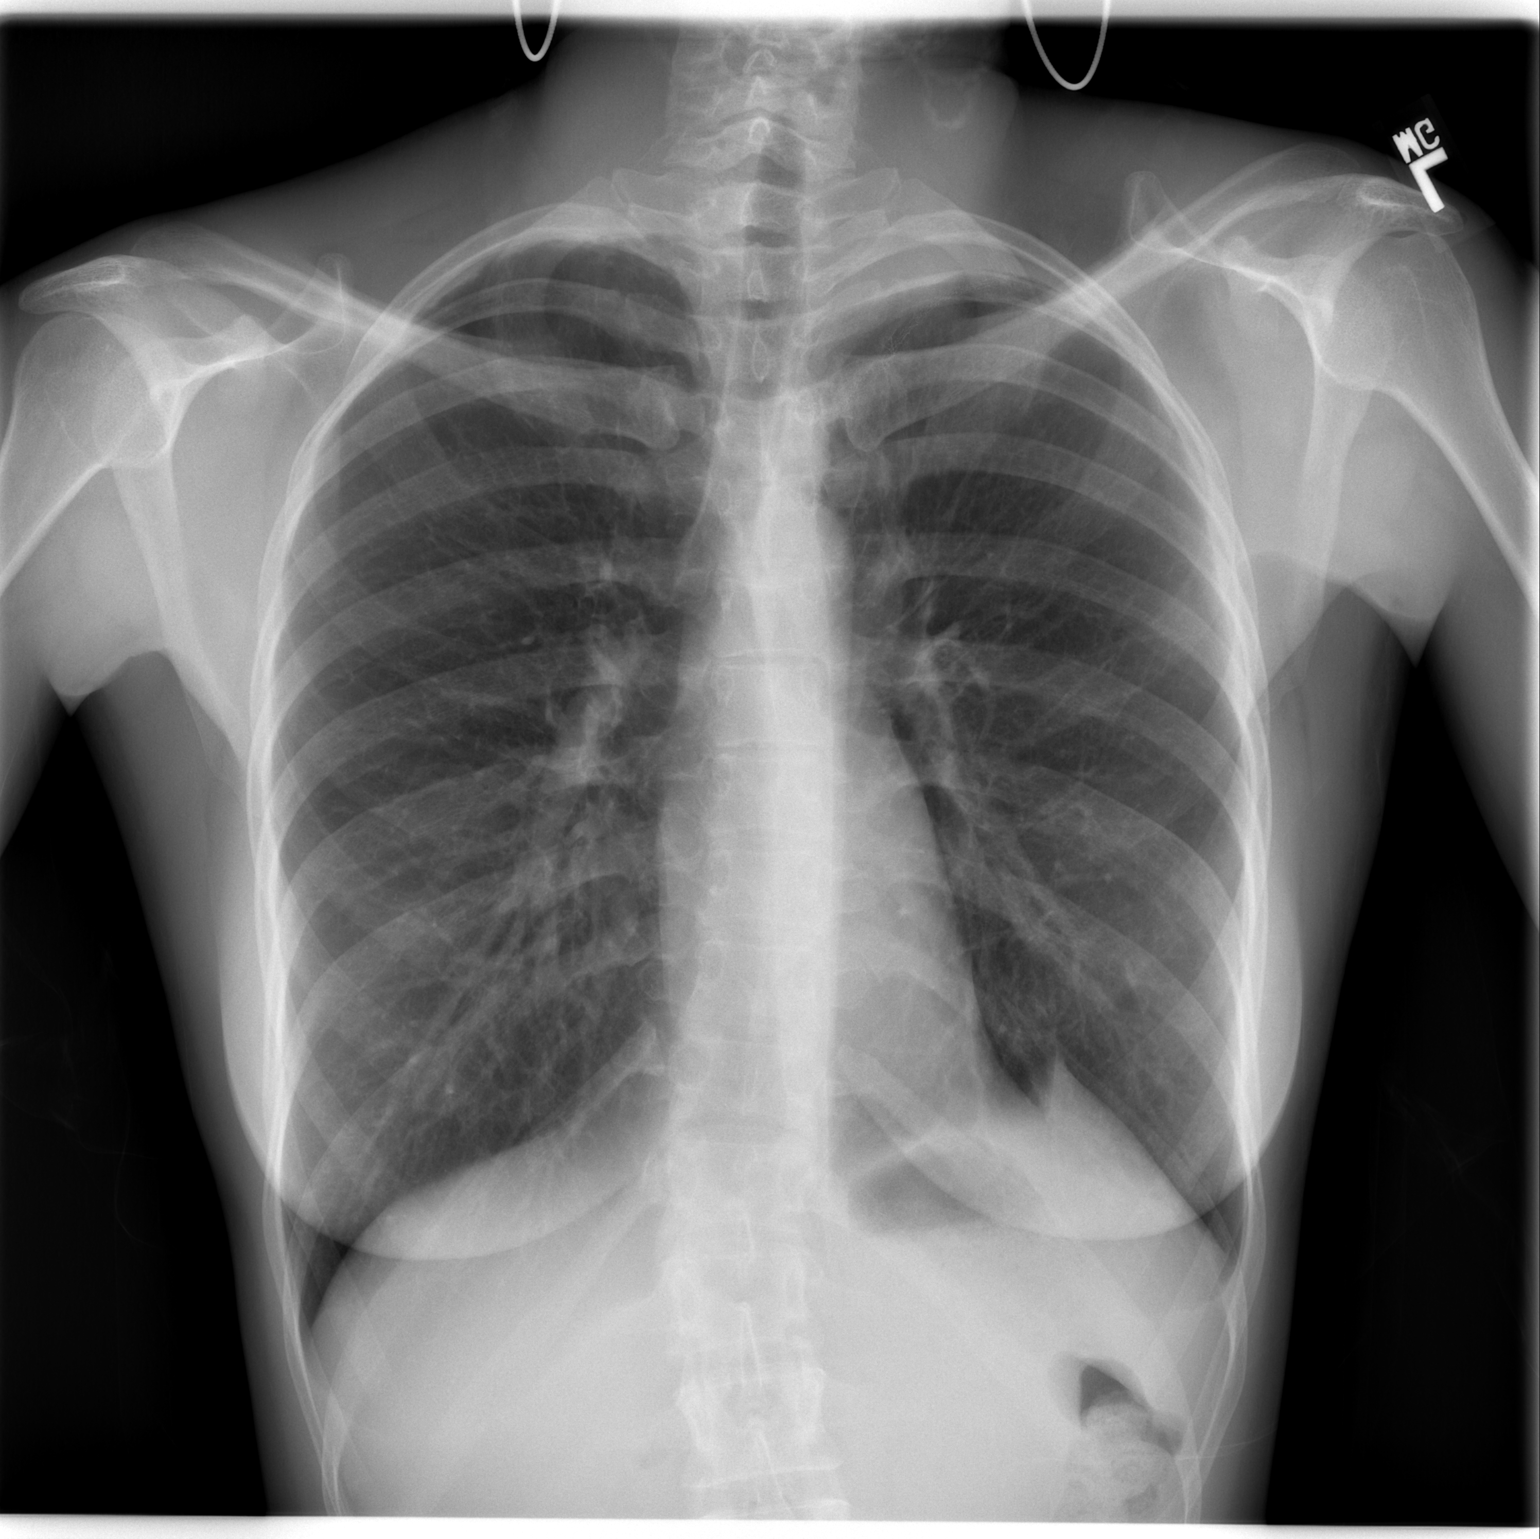

[w chest lat]
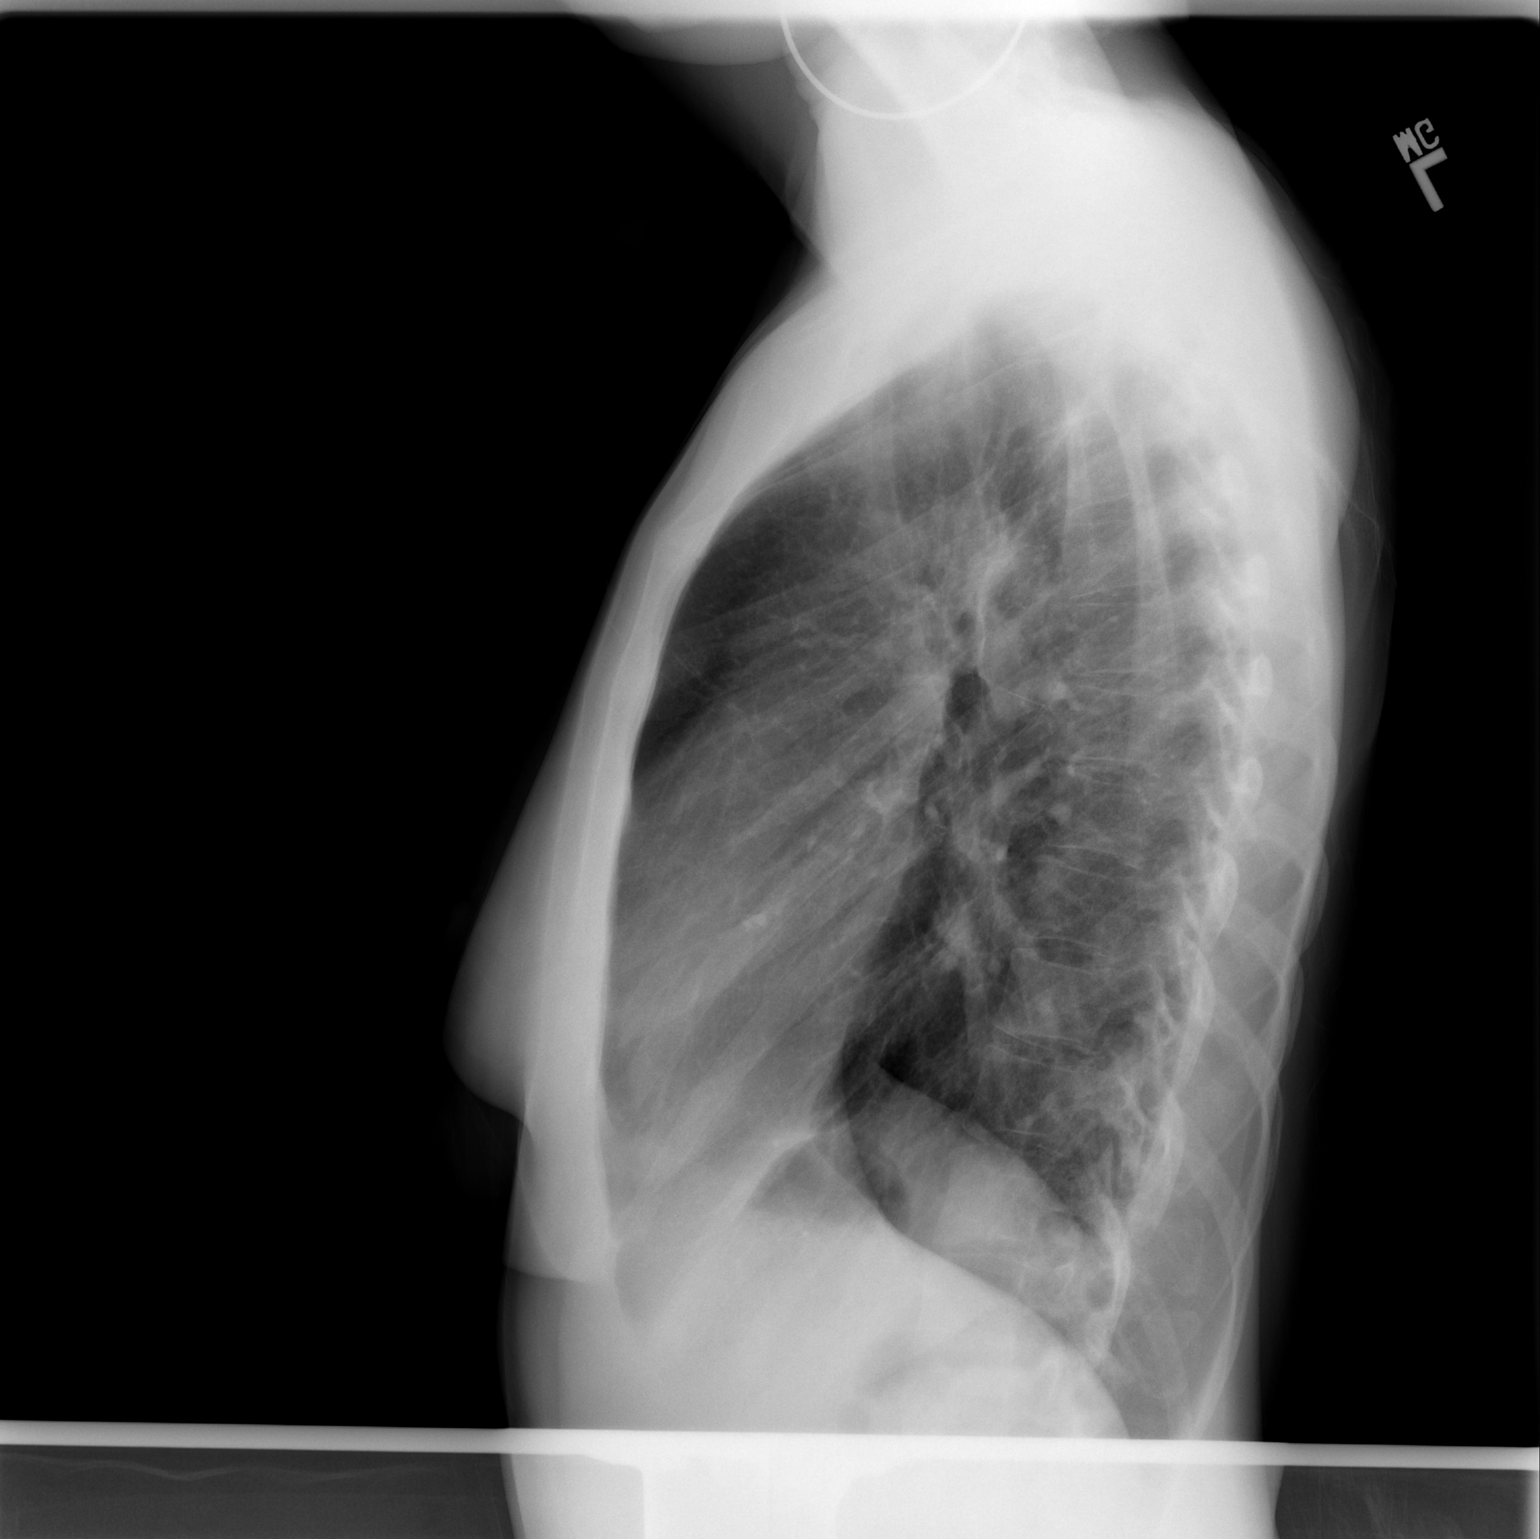

[2 of 2 positions shown; findings below may reference images not displayed]

FINDINGS: Stable postoperative changes at the left apex.  No
visible pneumothorax.  Stable pleural thickening.  Scarring at the
left lung base.  Right lung is clear.  Heart is normal size.  No
effusions.
IMPRESSION: Stable postoperative changes on the left with pleural thickening.
No pneumothorax.  No change.

## 2012-11-20 ENCOUNTER — Emergency Department (HOSPITAL_COMMUNITY): Payer: Medicaid Other

## 2012-11-20 ENCOUNTER — Emergency Department (HOSPITAL_COMMUNITY)
Admission: EM | Admit: 2012-11-20 | Discharge: 2012-11-20 | Disposition: A | Discharge status: Home / Self Care | Payer: Medicaid Other | Attending: Emergency Medicine | Admitting: Emergency Medicine

## 2012-11-20 ENCOUNTER — Encounter (HOSPITAL_COMMUNITY): Payer: Self-pay | Admitting: Nurse Practitioner

## 2012-11-20 DIAGNOSIS — R0789 Other chest pain: Secondary | ICD-10-CM

## 2012-11-20 DIAGNOSIS — R071 Chest pain on breathing: Secondary | ICD-10-CM | POA: Insufficient documentation

## 2012-11-20 DIAGNOSIS — Z87891 Personal history of nicotine dependence: Secondary | ICD-10-CM | POA: Insufficient documentation

## 2012-11-20 DIAGNOSIS — Z8619 Personal history of other infectious and parasitic diseases: Secondary | ICD-10-CM | POA: Insufficient documentation

## 2012-11-20 DIAGNOSIS — Z8709 Personal history of other diseases of the respiratory system: Secondary | ICD-10-CM | POA: Insufficient documentation

## 2012-11-20 LAB — CBC
HCT: 36.6 % (ref 36.0–46.0)
Hemoglobin: 12.4 g/dL (ref 12.0–15.0)
MCH: 31.6 pg (ref 26.0–34.0)
MCHC: 33.9 g/dL (ref 30.0–36.0)
MCV: 93.1 fL (ref 78.0–100.0)
Platelets: 308 10*3/uL (ref 150–400)
RBC: 3.93 MIL/uL (ref 3.87–5.11)
RDW: 13.6 % (ref 11.5–15.5)
WBC: 5.3 K/uL (ref 4.0–10.5)

## 2012-11-20 LAB — BASIC METABOLIC PANEL WITH GFR
BUN: 18 mg/dL (ref 6–23)
CO2: 25 meq/L (ref 19–32)
Calcium: 9.2 mg/dL (ref 8.4–10.5)
Chloride: 103 meq/L (ref 96–112)
Creatinine, Ser: 0.84 mg/dL (ref 0.50–1.10)
Glucose, Bld: 93 mg/dL (ref 70–99)

## 2012-11-20 LAB — BASIC METABOLIC PANEL
GFR calc Af Amer: >90 mL/min (ref >90)
GFR calc non Af Amer: >90 mL/min (ref >90)
Potassium: 3.8 mEq/L (ref 3.5–5.1)
Sodium: 136 mEq/L (ref 135–145)

## 2012-11-20 LAB — POCT I-STAT TROPONIN I

## 2012-11-20 MED ORDER — IBUPROFEN 600 MG PO TABS: 600.0000 mg | ORAL_TABLET | Freq: Four times a day (QID) | ORAL | Status: DC | PRN

## 2012-11-20 NOTE — ED Provider Notes (Signed)
History     CSN: [id number]  Arrival date & time 11/20/12  1441   First MD Initiated Contact with Patient 11/20/12 1603      Chief Complaint  Patient presents with  . Chest Pain    (Consider location/radiation/quality/duration/timing/severity/associated sxs/prior treatment) HPI Comments: Patient with past history of left-sided pneumothorax x2, thoracoscopy -- presents with onset of right upper chest pain that began acutely while she was standing at work. Patient states that she did not have shortness of breath like she did with previous pneumothorax. However, she was worried that she was having pneumothorax. She states that the pain was sharp and worse with deep breathing. It resolves when she is lying flat with minimal movement. No treatments prior to arrival. Pain does not radiate. No neck pain or headache. No abdominal pain, cough. Onset of symptoms acute. Course is intermittent.  The history is provided by the patient and medical records.    Past Medical History  Diagnosis Date  . Pneumothorax on left   . Hx of left breast biopsy   . Hx of cervical biopsy   . History of tobacco abuse   . Hx of gonorrhea   . Hx of chlamydia infection   . Hx of varicella     Past Surgical History  Procedure Laterality Date  . Insertion of left chest tube  01/20/11    Dr Donata Clay  . Biopsy of cervix    . Colposcopy vulva w/ biopsy      Normal results  . Video assisted thoracoscopy  08/24/2011    Procedure: VIDEO ASSISTED THORACOSCOPY;  Surgeon: Norton Blizzard, MD;  Location: [facility] OR;  Service: Thoracic;  Laterality: Left;  Left Video assisted thorascopic surgery , RESECTION OF APICIAL BLEB WITH PLEURODESIS  . Breast biopsy  2005    cyst removed L breast  . Tubal ligation  07/05/2012    Procedure: POST PARTUM TUBAL LIGATION;  Surgeon: Mickel Baas, MD;  Location: [facility] ORS;  Service: Gynecology;  Laterality: Bilateral;    Family History  Problem Relation Age of Onset  . Other Neg Hx   .  Hypertension Paternal Grandmother     History  Substance Use Topics  . Smoking status: Former Smoker    Types: Cigarettes    Quit date: 11/21/2010  . Smokeless tobacco: Never Used  . Alcohol Use: Yes     Comment: occasional    OB History   Grav Para Term Preterm Abortions TAB SAB Ect Mult Living   2 2 2       2       Review of Systems  Constitutional: Negative for fever and diaphoresis.  HENT: Negative for neck pain.   Eyes: Negative for redness.  Respiratory: Negative for cough and shortness of breath.   Cardiovascular: Positive for chest pain. Negative for palpitations and leg swelling.  Gastrointestinal: Negative for nausea, vomiting and abdominal pain.  Genitourinary: Negative for dysuria.  Musculoskeletal: Negative for back pain.  Skin: Negative for rash.  Neurological: Negative for syncope and light-headedness.    Allergies  Penicillins  Home Medications   Current Outpatient Rx  Name  Route  Sig  Dispense  Refill  . naproxen sodium (ANAPROX) 220 MG tablet   Oral   Take 440 mg by mouth 2 (two) times daily as needed (for headache).           BP 105/62  Pulse 64  Temp(Src) 98.3 F (36.8 C) (Oral)  Resp 12  SpO2 100%  LMP 11/17/2012  Physical Exam  Nursing note and vitals reviewed. Constitutional: She appears well-developed and well-nourished.  HENT:  Head: Normocephalic and atraumatic.  Mouth/Throat: Mucous membranes are normal. Mucous membranes are not dry.  Eyes: Conjunctivae are normal.  Neck: Trachea normal and normal range of motion. Neck supple. Normal carotid pulses and no JVD present. No muscular tenderness present. Carotid bruit is not present. No tracheal deviation present.  Cardiovascular: Normal rate, regular rhythm, S1 normal, S2 normal, normal heart sounds and intact distal pulses.  Exam reveals no decreased pulses.   No murmur heard. Pulses:      Radial pulses are 2+ on the right side, and 1+ on the left side.  Pulmonary/Chest:  Effort normal. No respiratory distress. She has no wheezes. She exhibits no tenderness.  Abdominal: Soft. Normal aorta and bowel sounds are normal. There is no tenderness. There is no rebound and no guarding.  Musculoskeletal: Normal range of motion.  Neurological: She is alert.  Skin: Skin is warm and dry. She is not diaphoretic. No cyanosis. No pallor.  Psychiatric: She has a normal mood and affect.    ED Course  Procedures (including critical care time)  Labs Reviewed  CBC  BASIC METABOLIC PANEL  POCT I-STAT TROPONIN I   Dg Chest 2 View  11/20/2012   *RADIOLOGY REPORT*  Clinical Data: Chest pain  CHEST - 2 VIEW  Comparison: 09/16/2011  Findings: Postsurgical changes in the left lung apex.  Associated tenting of the left hemidiaphragm.  The lungs are otherwise clear. No pleural effusion or pneumothorax.  The heart is normal in size.  Visualized osseous structures are within normal limits.  IMPRESSION: No evidence of acute cardiopulmonary disease.   Original Report Authenticated By: Charline Bills, M.D.     1. Chest wall pain     4:47 PM Patient seen and examined. Work-up initiated.  Vital signs reviewed and are as follows: Filed Vitals:   11/20/12 1556  BP: 105/62  Pulse: 64  Temp:   Resp: 12    Date: 11/20/2012  Rate: 75  Rhythm: normal sinus rhythm  QRS Axis: normal  Intervals: normal  ST/T Wave abnormalities: normal  Conduction Disutrbances:none  Narrative Interpretation:   Old EKG Reviewed: unchanged from 08/20/2011   Patient informed of results. She is reassured. Urged conservative management with NSAIDs as needed for symptomatic relief.  Patient urged to return with worsening pain or she develop shortness of breath.  Patient verbalizes understanding and agrees with the plan.      MDM  Patient with history of pneumothorax. No evidence of pneumothorax on x-ray today. Patient is not short of breath. Her vital signs are normal and stable. Feel this is more  likely chest wall pain and will treat conservatively. Patient appears well. Remainder of lab work is unrevealing. She is tall and thin, bilateral blood pressures equal.         Renne Crigler, PA-C 11/20/12 1819

## 2012-11-20 NOTE — ED Provider Notes (Signed)
Medical screening examination/treatment/procedure(s) were performed by non-physician practitioner and as supervising physician I was immediately available for consultation/collaboration.   Gavin Pound. Oletta Lamas, MD 11/20/12 1958

## 2012-11-20 NOTE — ED Notes (Addendum)
Pt reports "burning" feeling in chest, especially with inspiration, onset this afternoon. States it feels like when she had to have a chest tube for collapsed lung. Able to speak in full unlabored sentences

## 2013-03-16 ENCOUNTER — Other Ambulatory Visit: Payer: Self-pay | Admitting: Obstetrics and Gynecology

## 2013-03-23 ENCOUNTER — Other Ambulatory Visit: Payer: Self-pay | Admitting: Obstetrics and Gynecology

## 2013-03-23 ENCOUNTER — Encounter (HOSPITAL_COMMUNITY): Payer: Self-pay | Admitting: *Deleted

## 2013-04-03 ENCOUNTER — Encounter (HOSPITAL_COMMUNITY): Payer: Self-pay | Admitting: Pharmacist

## 2013-04-06 ENCOUNTER — Encounter (HOSPITAL_COMMUNITY)
Admission: RE | Disposition: A | Discharge status: Home / Self Care | Payer: Self-pay | Source: Ambulatory Visit | Attending: Obstetrics and Gynecology

## 2013-04-06 ENCOUNTER — Encounter (HOSPITAL_COMMUNITY): Payer: Self-pay | Admitting: *Deleted

## 2013-04-06 ENCOUNTER — Ambulatory Visit (HOSPITAL_COMMUNITY)
Admission: RE | Admit: 2013-04-06 | Discharge: 2013-04-06 | Disposition: A | Discharge status: Home / Self Care | Payer: Medicaid Other | Source: Ambulatory Visit | Attending: Obstetrics and Gynecology | Admitting: Obstetrics and Gynecology

## 2013-04-06 ENCOUNTER — Ambulatory Visit (HOSPITAL_COMMUNITY): Payer: Medicaid Other | Admitting: Anesthesiology

## 2013-04-06 ENCOUNTER — Encounter (HOSPITAL_COMMUNITY): Payer: Medicaid Other | Admitting: Anesthesiology

## 2013-04-06 DIAGNOSIS — N949 Unspecified condition associated with female genital organs and menstrual cycle: Secondary | ICD-10-CM | POA: Insufficient documentation

## 2013-04-06 DIAGNOSIS — N920 Excessive and frequent menstruation with regular cycle: Secondary | ICD-10-CM

## 2013-04-06 DIAGNOSIS — N938 Other specified abnormal uterine and vaginal bleeding: Secondary | ICD-10-CM | POA: Insufficient documentation

## 2013-04-06 HISTORY — PX: NOVASURE ABLATION: SHX5394

## 2013-04-06 HISTORY — DX: Headache: R51

## 2013-04-06 LAB — CBC
Hemoglobin: 12.2 g/dL (ref 12.0–15.0)
MCV: 93.8 fL (ref 78.0–100.0)
Platelets: 272 10*3/uL (ref 150–400)
RBC: 3.86 MIL/uL — ABNORMAL LOW (ref 3.87–5.11)
RDW: 12.9 % (ref 11.5–15.5)
WBC: 4.1 10*3/uL (ref 4.0–10.5)

## 2013-04-06 SURGERY — NOVASURE ABLATION
Anesthesia: General | Site: Uterus | Wound class: Clean Contaminated

## 2013-04-06 MED ORDER — LIDOCAINE HCL 1 % IJ SOLN
INTRAMUSCULAR | Status: AC
  Filled 2013-04-06: qty 20

## 2013-04-06 MED ORDER — ONDANSETRON HCL 4 MG/2ML IJ SOLN
INTRAMUSCULAR | Status: AC
  Filled 2013-04-06: qty 2

## 2013-04-06 MED ORDER — KETOROLAC TROMETHAMINE 30 MG/ML IJ SOLN
INTRAMUSCULAR | Status: DC | PRN
  Administered 2013-04-06: 30 mg via INTRAVENOUS

## 2013-04-06 MED ORDER — MEPERIDINE HCL 25 MG/ML IJ SOLN: 6.2500 mg | INTRAMUSCULAR | Status: DC | PRN

## 2013-04-06 MED ORDER — MIDAZOLAM HCL 2 MG/2ML IJ SOLN
INTRAMUSCULAR | Status: DC | PRN
  Administered 2013-04-06: 2 mg via INTRAVENOUS

## 2013-04-06 MED ORDER — PROPOFOL 10 MG/ML IV BOLUS
INTRAVENOUS | Status: DC | PRN
  Administered 2013-04-06: 160 mg via INTRAVENOUS

## 2013-04-06 MED ORDER — OXYCODONE-ACETAMINOPHEN 10-325 MG PO TABS: 1.0000 | ORAL_TABLET | ORAL | Status: DC | PRN

## 2013-04-06 MED ORDER — FENTANYL CITRATE 0.05 MG/ML IJ SOLN
INTRAMUSCULAR | Status: DC | PRN
  Administered 2013-04-06: 100 ug via INTRAVENOUS

## 2013-04-06 MED ORDER — LIDOCAINE HCL (CARDIAC) 20 MG/ML IV SOLN
INTRAVENOUS | Status: DC | PRN
  Administered 2013-04-06: 50 mg via INTRAVENOUS

## 2013-04-06 MED ORDER — LACTATED RINGERS IV SOLN
INTRAVENOUS | Status: DC
  Administered 2013-04-06 (×2): via INTRAVENOUS

## 2013-04-06 MED ORDER — METOCLOPRAMIDE HCL 5 MG/ML IJ SOLN: 10.0000 mg | Freq: Once | INTRAMUSCULAR | Status: DC | PRN

## 2013-04-06 MED ORDER — LIDOCAINE HCL 1 % IJ SOLN
INTRAMUSCULAR | Status: DC | PRN
  Administered 2013-04-06: 20 mL

## 2013-04-06 MED ORDER — KETOROLAC TROMETHAMINE 30 MG/ML IJ SOLN: 15.0000 mg | Freq: Once | INTRAMUSCULAR | Status: DC | PRN

## 2013-04-06 MED ORDER — MIDAZOLAM HCL 2 MG/2ML IJ SOLN
INTRAMUSCULAR | Status: AC
  Filled 2013-04-06: qty 2

## 2013-04-06 MED ORDER — OXYCODONE-ACETAMINOPHEN 5-325 MG PO TABS
ORAL_TABLET | ORAL | Status: AC
  Filled 2013-04-06: qty 1

## 2013-04-06 MED ORDER — LIDOCAINE HCL (CARDIAC) 20 MG/ML IV SOLN
INTRAVENOUS | Status: AC
  Filled 2013-04-06: qty 5

## 2013-04-06 MED ORDER — PROPOFOL 10 MG/ML IV EMUL
INTRAVENOUS | Status: AC
  Filled 2013-04-06: qty 20

## 2013-04-06 MED ORDER — CEFAZOLIN SODIUM-DEXTROSE 2-3 GM-% IV SOLR
INTRAVENOUS | Status: AC
  Filled 2013-04-06: qty 50

## 2013-04-06 MED ORDER — DEXAMETHASONE SODIUM PHOSPHATE 10 MG/ML IJ SOLN
INTRAMUSCULAR | Status: AC
  Filled 2013-04-06: qty 1

## 2013-04-06 MED ORDER — FENTANYL CITRATE 0.05 MG/ML IJ SOLN: 25.0000 ug | INTRAMUSCULAR | Status: DC | PRN

## 2013-04-06 MED ORDER — CEFAZOLIN SODIUM-DEXTROSE 2-3 GM-% IV SOLR
2.0000 g | INTRAVENOUS | Status: AC
  Administered 2013-04-06: 2 g via INTRAVENOUS

## 2013-04-06 MED ORDER — DEXAMETHASONE SODIUM PHOSPHATE 10 MG/ML IJ SOLN
INTRAMUSCULAR | Status: DC | PRN
  Administered 2013-04-06: 10 mg via INTRAVENOUS

## 2013-04-06 MED ORDER — OXYCODONE-ACETAMINOPHEN 5-325 MG PO TABS
1.0000 | ORAL_TABLET | ORAL | Status: DC | PRN
  Administered 2013-04-06: 1 via ORAL

## 2013-04-06 MED ORDER — 0.9 % SODIUM CHLORIDE (POUR BTL) OPTIME
TOPICAL | Status: DC | PRN
  Administered 2013-04-06: 1000 mL

## 2013-04-06 MED ORDER — ONDANSETRON HCL 4 MG/2ML IJ SOLN
INTRAMUSCULAR | Status: DC | PRN
  Administered 2013-04-06: 4 mg via INTRAMUSCULAR

## 2013-04-06 MED ORDER — FENTANYL CITRATE 0.05 MG/ML IJ SOLN
INTRAMUSCULAR | Status: AC
  Filled 2013-04-06: qty 2

## 2013-04-06 MED ORDER — KETOROLAC TROMETHAMINE 30 MG/ML IJ SOLN
INTRAMUSCULAR | Status: AC
  Filled 2013-04-06: qty 1

## 2013-04-06 SURGICAL SUPPLY — 13 items
ABLATOR ENDOMETRIAL BIPOLAR (ABLATOR) ×2 IMPLANT
CATH ROBINSON RED A/P 16FR (CATHETERS) ×2 IMPLANT
CLOTH BEACON ORANGE TIMEOUT ST (SAFETY) ×2 IMPLANT
CONTAINER PREFILL 10% NBF 60ML (FORM) IMPLANT
GLOVE ECLIPSE 7.0 STRL STRAW (GLOVE) ×4 IMPLANT
GOWN PREVENTION PLUS LG XLONG (DISPOSABLE) ×2 IMPLANT
GOWN PREVENTION PLUS XLARGE (GOWN DISPOSABLE) ×2 IMPLANT
NDL SPNL 22GX3.5 QUINCKE BK (NEEDLE) ×1 IMPLANT
NEEDLE SPNL 22GX3.5 QUINCKE BK (NEEDLE) ×2 IMPLANT
PACK VAGINAL MINOR WOMEN LF (CUSTOM PROCEDURE TRAY) ×2 IMPLANT
SYR CONTROL 10ML LL (SYRINGE) ×2 IMPLANT
TOWEL OR 17X24 6PK STRL BLUE (TOWEL DISPOSABLE) ×4 IMPLANT
WATER STERILE IRR 1000ML POUR (IV SOLUTION) ×2 IMPLANT

## 2013-04-06 NOTE — Anesthesia Preprocedure Evaluation (Signed)
Anesthesia Evaluation  Patient identified by MRN, date of birth, ID band Patient awake    Reviewed: Allergy & Precautions, H&P , NPO status , Patient's Chart, lab work & pertinent test results, reviewed documented beta blocker date and time   History of Anesthesia Complications Negative for: history of anesthetic complications  Airway Mallampati: I TM Distance: >3 FB Neck ROM: full    Dental  (+) Teeth Intact   Pulmonary former smoker (quit 2012),  H/p ptx. Had VATS to evaluate in 3/13 - found cysts on lung which were removed breath sounds clear to auscultation        Cardiovascular negative cardio ROS  Rhythm:regular Rate:Normal     Neuro/Psych negative neurological ROS  negative psych ROS   GI/Hepatic negative GI ROS, Neg liver ROS,   Endo/Other  negative endocrine ROS  Renal/GU negative Renal ROS  Female GU complaint     Musculoskeletal   Abdominal   Peds  Hematology negative hematology ROS (+)   Anesthesia Other Findings   Reproductive/Obstetrics                           Anesthesia Physical  Anesthesia Plan  ASA: I  Anesthesia Plan: General LMA   Post-op Pain Management:    Induction:   Airway Management Planned:   Additional Equipment:   Intra-op Plan:   Post-operative Plan:   Informed Consent: I have reviewed the patients History and Physical, chart, labs and discussed the procedure including the risks, benefits and alternatives for the proposed anesthesia with the patient or authorized representative who has indicated his/her understanding and acceptance.     Plan Discussed with:   Anesthesia Plan Comments:         Anesthesia Quick Evaluation

## 2013-04-06 NOTE — Progress Notes (Deleted)
Pt presents to OR for Novasure ablation. She states that problems began after BTL. Menses are heavy and painful. She does not desire a trial of OCPs or Mirena.  IMP/ DUB, menorrhagia PLAN/ Novasure Ablation

## 2013-04-06 NOTE — Transfer of Care (Signed)
Immediate Anesthesia Transfer of Care Note  Patient: [name]  Procedure(s) Performed: Procedure(s): NOVASURE ABLATION (N/A)  Patient Location: PACU  Anesthesia Type:General  Level of Consciousness: awake  Airway & Oxygen Therapy: Patient Spontanous Breathing  Post-op Assessment: Report given to PACU RN  Post vital signs: stable  Filed Vitals:   04/06/13 1045  BP: 103/63  Pulse: 62  Temp: 36.5 C  Resp: 18    Complications: No apparent anesthesia complications

## 2013-04-06 NOTE — Progress Notes (Deleted)
Allergies-?wnl PE: Abd-wnl         Pelvic-wnl Plan/ Proceed with Novasure

## 2013-04-07 ENCOUNTER — Encounter (HOSPITAL_COMMUNITY): Payer: Self-pay | Admitting: Obstetrics and Gynecology

## 2013-04-07 NOTE — Op Note (Signed)
NAME:  [name], SOBERANIS NO.:  [id number]  MEDICAL RECORD NO.:  [id number]  LOCATION:  [facility]                          FACILITY:  [facility]  PHYSICIAN:  Malva Limes, M.D.    DATE OF BIRTH:  08/31/1980  DATE OF PROCEDURE:  04/06/2013 DATE OF DISCHARGE:  04/06/2013                              OPERATIVE REPORT   PREOPERATIVE DIAGNOSIS:  Menorrhagia.  POSTOPERATIVE DIAGNOSIS:  Menorrhagia.  PROCEDURE:  NovaSure endometrial ablation.  SURGEON:  Malva Limes, M.D.  ANESTHESIA:  General and local.  ANTIBIOTICS:  Ancef 2 g.  DRAINS:  None.  ESTIMATED BLOOD LOSS:  Minimal.  PROCEDURE:  The patient was taken to the operating room where she was placed in a dorsal supine position.  General anesthetic was administered without difficulty.  She was then placed in dorsal lithotomy position. She was prepped and draped in the usual fashion for this procedure. Exam under anesthesia revealed an anteverted uterus of normal size and shape.  There were no adnexal masses.  A sterile speculum was placed in the vagina.  10 mL of 1% lidocaine was used for paracervical block.  The cervix was serially dilated to a 29-French.  The uterus sounded to 7 cm. The cervical length was 1.5 cm.  NovaSure device was placed into the uterine cavity and opened.  The width was 4.4 cm.  The device was turned on for a total of 72 seconds.  The patient tolerated the procedure well. She was awoken and taken to the recovery room in stable condition. Instrument and lap counts were correct x2.  She will be discharged to home with Percocet to take p.r.n.  She will follow up in the office in 4 weeks.          ______________________________ Malva Limes, M.D.     MA/MEDQ  D:  04/06/2013  T:  04/07/2013  Job:  [id number]

## 2013-04-08 NOTE — H&P (Signed)
[name] is an 32 y.o. G91P2002 Unknown female.   Chief Complaint: Menorrhagia HPI: Pt is a 32 yr old black female, G2P2, who presents for ablation. She has had a BTL and declined OCPs, IUD, or other tx options.  Past Medical History  Diagnosis Date  . Pneumothorax on left   . Hx of left breast biopsy   . Hx of cervical biopsy   . History of tobacco abuse   . Hx of gonorrhea   . Hx of chlamydia infection   . Hx of varicella   . Headache(784.0)     otc med prn    Past Surgical History  Procedure Laterality Date  . Insertion of left chest tube  01/20/11    Dr Donata Clay  . Biopsy of cervix    . Colposcopy vulva w/ biopsy      Normal results  . Video assisted thoracoscopy  08/24/2011    Procedure: VIDEO ASSISTED THORACOSCOPY;  Surgeon: Norton Blizzard, MD;  Location: [facility] OR;  Service: Thoracic;  Laterality: Left;  Left Video assisted thorascopic surgery , RESECTION OF APICIAL BLEB WITH PLEURODESIS  . Breast biopsy  2005    cyst removed L breast  . Tubal ligation  07/05/2012    Procedure: POST PARTUM TUBAL LIGATION;  Surgeon: Mickel Baas, MD;  Location: [facility] ORS;  Service: Gynecology;  Laterality: Bilateral;  . Novasure ablation N/A 04/06/2013    Procedure: NOVASURE ABLATION;  Surgeon: Levi Aland, MD;  Location: [facility]S;  Service: Gynecology;  Laterality: N/A;    Family History  Problem Relation Age of Onset  . Other Neg Hx   . Hypertension Paternal Grandmother    Social History:  reports that she quit smoking about 2 years ago. Her smoking use included Cigarettes. She smoked 0.50 packs per day. She has never used smokeless tobacco. She reports that she drinks alcohol. She reports that she does not use illicit drugs.  Allergies:  Allergies  Allergen Reactions  . Penicillins Rash    Family h/o allergy. Can tolerate amoxicillin ok.    No prescriptions prior to admission       Blood pressure 102/65, pulse 54, temperature 97.8 F (36.6 C), temperature source  Oral, resp. rate 14, height 5\' 9"  (1.753 m), weight 57.153 kg (126 lb), last menstrual period 03/05/2013, SpO2 99.00%, not currently breastfeeding. General appearance: alert, cooperative and no distress Abdomen: soft, non-tender; bowel sounds normal; no masses,  no organomegaly Pelvic: cervix normal in appearance, external genitalia normal, no adnexal masses or tenderness, no cervical motion tenderness, rectovaginal septum normal, uterus normal size, shape, and consistency and vagina normal without discharge   Lab Results  Component Value Date   WBC 4.1 04/06/2013   HGB 12.2 04/06/2013   HCT 36.2 04/06/2013   MCV 93.8 04/06/2013   PLT 272 04/06/2013   Lab Results  Component Value Date   PREGTESTUR NEGATIVE 08/16/2011     Assessment/Plan Proceed with Ablation  Patient Active Problem List   Diagnosis Date Noted  . LSIL (low grade squamous intraepithelial lesion) on Pap smear 07/27/2011  . Pneumothorax on left   . Hx of left breast biopsy   . Hx of cervical biopsy   . History of tobacco abuse     [name] 04/08/2013, 8:44 AM

## 2013-04-10 NOTE — Anesthesia Postprocedure Evaluation (Signed)
  Anesthesia Post-op Note  Anesthesia Post Note  Patient: [name]  Procedure(s) Performed: Procedure(s) (LRB): NOVASURE ABLATION (N/A)  Anesthesia type: General  Patient location: PACU  Post pain: Pain level controlled  Post assessment: Post-op Vital signs reviewed  Last Vitals:  Filed Vitals:   04/06/13 1402  BP:   Pulse: 54  Temp: 36.6 C  Resp:     Post vital signs: Reviewed  Level of consciousness: sedated  Complications: No apparent anesthesia complications

## 2014-01-01 ENCOUNTER — Encounter (HOSPITAL_COMMUNITY): Payer: Self-pay | Admitting: Emergency Medicine

## 2014-01-01 ENCOUNTER — Emergency Department (INDEPENDENT_AMBULATORY_CARE_PROVIDER_SITE_OTHER)
Admission: EM | Admit: 2014-01-01 | Discharge: 2014-01-01 | Disposition: A | Discharge status: Home / Self Care | Payer: BC Managed Care – PPO | Source: Home / Self Care | Attending: Family Medicine | Admitting: Family Medicine

## 2014-01-01 DIAGNOSIS — J00 Acute nasopharyngitis [common cold]: Secondary | ICD-10-CM

## 2014-01-01 LAB — POCT RAPID STREP A: Streptococcus, Group A Screen (Direct): NEGATIVE

## 2014-01-01 MED ORDER — BENZONATATE 100 MG PO CAPS: 100.0000 mg | ORAL_CAPSULE | Freq: Three times a day (TID) | ORAL | Status: DC | PRN

## 2014-01-01 MED ORDER — IPRATROPIUM BROMIDE 0.06 % NA SOLN: 2.0000 | Freq: Four times a day (QID) | NASAL | Status: DC

## 2014-01-01 NOTE — ED Provider Notes (Signed)
CSN: [id number]     Arrival date & time 01/01/14  1725 History   First MD Initiated Contact with Patient 01/01/14 1906     Chief Complaint  Patient presents with  . URI   (Consider location/radiation/quality/duration/timing/severity/associated sxs/prior Treatment) Patient is a 33 y.o. female presenting with URI.  URI Presenting symptoms: congestion, cough, rhinorrhea and sore throat   Presenting symptoms: no fever   Severity:  Moderate Onset quality:  Gradual Duration:  4 days Chronicity:  New Associated symptoms: headaches   Associated symptoms: no wheezing     Past Medical History  Diagnosis Date  . Pneumothorax on left   . Hx of left breast biopsy   . Hx of cervical biopsy   . History of tobacco abuse   . Hx of gonorrhea   . Hx of chlamydia infection   . Hx of varicella   . Headache(784.0)     otc med prn   Past Surgical History  Procedure Laterality Date  . Insertion of left chest tube  01/20/11    Dr Donata ClayVan Trigt  . Biopsy of cervix    . Colposcopy vulva w/ biopsy      Normal results  . Video assisted thoracoscopy  08/24/2011    Procedure: VIDEO ASSISTED THORACOSCOPY;  Surgeon: Norton Blizzard Patrick Burney, MD;  [facility];  Service: Thoracic;  Laterality: Left;  Left Video assisted thorascopic surgery , RESECTION OF APICIAL BLEB WITH PLEURODESIS  . Breast biopsy  2005    cyst removed L breast  . Tubal ligation  07/05/2012    Procedure: POST PARTUM TUBAL LIGATION;  Surgeon: Mickel Baasichard D Kaplan, MD;  Location: [facility];  Service: Gynecology;  Laterality: Bilateral;  . Novasure ablation N/A 04/06/2013    Procedure: NOVASURE ABLATION;  Surgeon: Levi AlandMark E Anderson, MD;  Location: [facility];  Service: Gynecology;  Laterality: N/A;   Family History  Problem Relation Age of Onset  . Other Neg Hx   . Hypertension Paternal Grandmother    History  Substance Use Topics  . Smoking status: Former Smoker -- 0.50 packs/day    Types: Cigarettes    Quit date: 11/21/2010  . Smokeless tobacco:  Never Used  . Alcohol Use: Yes     Comment: socially   OB History   Grav Para Term Preterm Abortions TAB SAB Ect Mult Living   2 2 2       2      Review of Systems  Constitutional: Negative for fever.  HENT: Positive for congestion, rhinorrhea and sore throat.   Respiratory: Positive for cough. Negative for wheezing.   Neurological: Positive for headaches.  All other systems reviewed and are negative.   Allergies  Penicillins  Home Medications   Prior to Admission medications   Medication Sig Start Date End Date Taking? Authorizing Provider  benzonatate (TESSALON) 100 MG capsule Take 1 capsule (100 mg total) by mouth 3 (three) times daily as needed for cough. 01/01/14   Ardis RowanJennifer Lee Nariah Morgano, PA  ibuprofen (ADVIL,MOTRIN) 600 MG tablet Take 1 tablet (600 mg total) by mouth every 6 (six) hours as needed for pain. 11/20/12   Renne CriglerJoshua Geiple, PA-C  ipratropium (ATROVENT) 0.06 % nasal spray Place 2 sprays into both nostrils 4 (four) times daily. For nasal congestion 01/01/14   Ardis RowanJennifer Lee Rhandi Despain, PA  Multiple Vitamin (MULTIVITAMIN WITH MINERALS) TABS tablet Take 1 tablet by mouth daily.    Historical Provider, MD  oxyCODONE-acetaminophen (PERCOCET) 10-325 MG per tablet Take 1 tablet by mouth every 4 (  four) hours as needed for pain. 04/06/13   Levi Aland, MD   BP 127/80  Pulse 62  Temp(Src) 98.1 F (36.7 C) (Oral)  Resp 14  SpO2 100% Physical Exam  Nursing note and vitals reviewed. Constitutional: She is oriented to person, place, and time. She appears well-developed and well-nourished. No distress.  HENT:  Head: Normocephalic and atraumatic.  Right Ear: Hearing, tympanic membrane, external ear and ear canal normal. No mastoid tenderness. Tympanic membrane is not injected. No middle ear effusion.  Left Ear: Hearing, tympanic membrane, external ear and ear canal normal. No mastoid tenderness. Tympanic membrane is not injected.  No middle ear effusion.  Nose: Mucosal edema and  rhinorrhea present.  Mouth/Throat: Uvula is midline and mucous membranes are normal. No oral lesions. No trismus in the jaw. No uvula swelling. Posterior oropharyngeal erythema present. No oropharyngeal exudate, posterior oropharyngeal edema or tonsillar abscesses.  Eyes: Conjunctivae are normal. No scleral icterus.  Neck: Normal range of motion. Neck supple.  Cardiovascular: Normal rate, regular rhythm and normal heart sounds.   Pulmonary/Chest: Effort normal and breath sounds normal. No stridor.  Musculoskeletal: Normal range of motion.  Lymphadenopathy:    She has no cervical adenopathy.  Neurological: She is alert and oriented to person, place, and time.  Skin: Skin is warm and dry.  Psychiatric: She has a normal mood and affect. Her behavior is normal.    ED Course  Procedures (including critical care time) Labs Review Labs Reviewed  POCT RAPID STREP A ([facility] CARE ONLY)    Imaging Review No results found.   MDM   1. Common cold    Rapid strep negative. Advised regarding symptomatic care of common cold. Atrovent nasal spray for nasal congestion. Tessalon for cough. Follow up prn.   Ardis Rowan, PA 01/01/14 [phone]

## 2014-01-01 NOTE — Discharge Instructions (Signed)
Upper Respiratory Infection, Adult An upper respiratory infection (URI) is also sometimes known as the common cold. The upper respiratory tract includes the nose, sinuses, throat, trachea, and bronchi. Bronchi are the airways leading to the lungs. Most people improve within 1 week, but symptoms can last up to 2 weeks. A residual cough may last even longer.  CAUSES Many different viruses can infect the tissues lining the upper respiratory tract. The tissues become irritated and inflamed and often become very moist. Mucus production is also common. A cold is contagious. You can easily spread the virus to others by oral contact. This includes kissing, sharing a glass, coughing, or sneezing. Touching your mouth or nose and then touching a surface, which is then touched by another person, can also spread the virus. SYMPTOMS  Symptoms typically develop 1 to 3 days after you come in contact with a cold virus. Symptoms vary from person to person. They may include:  Runny nose.  Sneezing.  Nasal congestion.  Sinus irritation.  Sore throat.  Loss of voice (laryngitis).  Cough.  Fatigue.  Muscle aches.  Loss of appetite.  Headache.  Low-grade fever. DIAGNOSIS  You might diagnose your own cold based on familiar symptoms, since most people get a cold 2 to 3 times a year. Your caregiver can confirm this based on your exam. Most importantly, your caregiver can check that your symptoms are not due to another disease such as strep throat, sinusitis, pneumonia, asthma, or epiglottitis. Blood tests, throat tests, and X-rays are not necessary to diagnose a common cold, but they may sometimes be helpful in excluding other more serious diseases. Your caregiver will decide if any further tests are required. RISKS AND COMPLICATIONS  You may be at risk for a more severe case of the common cold if you smoke cigarettes, have chronic heart disease (such as heart failure) or lung disease (such as asthma), or if  you have a weakened immune system. The very young and very old are also at risk for more serious infections. Bacterial sinusitis, middle ear infections, and bacterial pneumonia can complicate the common cold. The common cold can worsen asthma and chronic obstructive pulmonary disease (COPD). Sometimes, these complications can require emergency medical care and may be life-threatening. PREVENTION  The best way to protect against getting a cold is to practice good hygiene. Avoid oral or hand contact with people with cold symptoms. Wash your hands often if contact occurs. There is no clear evidence that vitamin C, vitamin E, echinacea, or exercise reduces the chance of developing a cold. However, it is always recommended to get plenty of rest and practice good nutrition. TREATMENT  Treatment is directed at relieving symptoms. There is no cure. Antibiotics are not effective, because the infection is caused by a virus, not by bacteria. Treatment may include:  Increased fluid intake. Sports drinks offer valuable electrolytes, sugars, and fluids.  Breathing heated mist or steam (vaporizer or shower).  Eating chicken soup or other clear broths, and maintaining good nutrition.  Getting plenty of rest.  Using gargles or lozenges for comfort.  Controlling fevers with ibuprofen or acetaminophen as directed by your caregiver.  Increasing usage of your inhaler if you have asthma. Zinc gel and zinc lozenges, taken in the first 24 hours of the common cold, can shorten the duration and lessen the severity of symptoms. Pain medicines may help with fever, muscle aches, and throat pain. A variety of non-prescription medicines are available to treat congestion and runny nose. Your caregiver   can make recommendations and may suggest nasal or lung inhalers for other symptoms.  HOME CARE INSTRUCTIONS   Only take over-the-counter or prescription medicines for pain, discomfort, or fever as directed by your  caregiver.  Use a warm mist humidifier or inhale steam from a shower to increase air moisture. This may keep secretions moist and make it easier to breathe.  Drink enough water and fluids to keep your urine clear or pale yellow.  Rest as needed.  Return to work when your temperature has returned to normal or as your caregiver advises. You may need to stay home longer to avoid infecting others. You can also use a face mask and careful hand washing to prevent spread of the virus. SEEK MEDICAL CARE IF:   After the first few days, you feel you are getting worse rather than better.  You need your caregiver's advice about medicines to control symptoms.  You develop chills, worsening shortness of breath, or brown or red sputum. These may be signs of pneumonia.  You develop yellow or brown nasal discharge or pain in the face, especially when you bend forward. These may be signs of sinusitis.  You develop a fever, swollen neck glands, pain with swallowing, or white areas in the back of your throat. These may be signs of strep throat. SEEK IMMEDIATE MEDICAL CARE IF:   You have a fever.  You develop severe or persistent headache, ear pain, sinus pain, or chest pain.  You develop wheezing, a prolonged cough, cough up blood, or have a change in your usual mucus (if you have chronic lung disease).  You develop sore muscles or a stiff neck. Document Released: 12/02/2000 Document Revised: 08/31/2011 Document Reviewed: 10/10/2010 [employer] Patient Information 2015 [employer]. This information is not intended to replace advice given to you by your health care provider. Make sure you discuss any questions you have with your health care provider.  

## 2014-01-01 NOTE — ED Notes (Signed)
Pt c/o cold/allergy sx x 4 days Sx include: nasal congestion, ST, hoarseness, dry cough, HA due to cough, and x3 episodes of epistaxis today Denies f/vn/d, SOB, wheezing Taking OTC cold meds w/no relief.  Alert w/no signs of acute distress.

## 2014-01-01 NOTE — ED Provider Notes (Signed)
Medical screening examination/treatment/procedure(s) were performed by resident physician or non-physician practitioner and as supervising physician I was immediately available for consultation/collaboration.   Careli Luzader DOUGLAS MD.   Shealynn Saulnier D Zahrah Sutherlin, MD 01/01/14 2123 

## 2014-01-03 LAB — CULTURE, GROUP A STREP

## 2014-03-05 ENCOUNTER — Other Ambulatory Visit: Payer: Self-pay | Admitting: Obstetrics and Gynecology

## 2014-04-23 ENCOUNTER — Encounter (HOSPITAL_COMMUNITY): Payer: Self-pay | Admitting: Emergency Medicine

## 2014-06-19 ENCOUNTER — Other Ambulatory Visit: Payer: Self-pay | Admitting: Obstetrics and Gynecology

## 2014-12-13 ENCOUNTER — Other Ambulatory Visit: Payer: Self-pay | Admitting: Obstetrics and Gynecology

## 2014-12-14 LAB — CYTOLOGY - PAP

## 2014-12-20 ENCOUNTER — Other Ambulatory Visit: Payer: Self-pay | Admitting: Obstetrics and Gynecology

## 2015-03-08 ENCOUNTER — Encounter (HOSPITAL_COMMUNITY): Payer: Self-pay | Admitting: *Deleted

## 2015-03-08 ENCOUNTER — Emergency Department (HOSPITAL_COMMUNITY)
Admission: EM | Admit: 2015-03-08 | Discharge: 2015-03-08 | Disposition: A | Discharge status: Home / Self Care | Payer: Self-pay | Attending: Emergency Medicine | Admitting: Emergency Medicine

## 2015-03-08 DIAGNOSIS — Z79899 Other long term (current) drug therapy: Secondary | ICD-10-CM | POA: Insufficient documentation

## 2015-03-08 DIAGNOSIS — Z8719 Personal history of other diseases of the digestive system: Secondary | ICD-10-CM | POA: Insufficient documentation

## 2015-03-08 DIAGNOSIS — R531 Weakness: Secondary | ICD-10-CM | POA: Insufficient documentation

## 2015-03-08 DIAGNOSIS — R42 Dizziness and giddiness: Secondary | ICD-10-CM | POA: Insufficient documentation

## 2015-03-08 DIAGNOSIS — Z87891 Personal history of nicotine dependence: Secondary | ICD-10-CM | POA: Insufficient documentation

## 2015-03-08 DIAGNOSIS — Z8619 Personal history of other infectious and parasitic diseases: Secondary | ICD-10-CM | POA: Insufficient documentation

## 2015-03-08 DIAGNOSIS — Z3202 Encounter for pregnancy test, result negative: Secondary | ICD-10-CM | POA: Insufficient documentation

## 2015-03-08 DIAGNOSIS — Z880 Allergy status to penicillin: Secondary | ICD-10-CM | POA: Insufficient documentation

## 2015-03-08 LAB — URINALYSIS, ROUTINE W REFLEX MICROSCOPIC
Bilirubin Urine: NEGATIVE
Glucose, UA: NEGATIVE mg/dL
HGB URINE DIPSTICK: NEGATIVE
Ketones, ur: NEGATIVE mg/dL
LEUKOCYTES UA: NEGATIVE
Nitrite: NEGATIVE
PROTEIN: NEGATIVE mg/dL
Specific Gravity, Urine: 1.022 (ref 1.005–1.030)
UROBILINOGEN UA: 1 mg/dL (ref 0.0–1.0)
pH: 7.5 (ref 5.0–8.0)

## 2015-03-08 LAB — BASIC METABOLIC PANEL
Anion gap: 6 (ref 5–15)
BUN: 13 mg/dL (ref 6–20)
CHLORIDE: 104 mmol/L (ref 101–111)
CO2: 25 mmol/L (ref 22–32)
Calcium: 8.9 mg/dL (ref 8.9–10.3)
Creatinine, Ser: 0.84 mg/dL (ref 0.44–1.00)
Glucose, Bld: 88 mg/dL (ref 65–99)
POTASSIUM: 5.1 mmol/L (ref 3.5–5.1)
Sodium: 135 mmol/L (ref 135–145)

## 2015-03-08 LAB — CBC WITH DIFFERENTIAL/PLATELET
BASOS ABS: 0.1 10*3/uL (ref 0.0–0.1)
BASOS PCT: 1 %
EOS PCT: 6 %
Eosinophils Absolute: 0.3 10*3/uL (ref 0.0–0.7)
HCT: 36.6 % (ref 36.0–46.0)
HEMOGLOBIN: 12.2 g/dL (ref 12.0–15.0)
Lymphocytes Relative: 43 %
Lymphs Abs: 2.5 10*3/uL (ref 0.7–4.0)
MCH: 32.1 pg (ref 26.0–34.0)
MCHC: 33.3 g/dL (ref 30.0–36.0)
MCV: 96.3 fL (ref 78.0–100.0)
Monocytes Absolute: 0.4 10*3/uL (ref 0.1–1.0)
Monocytes Relative: 8 %
NEUTROS PCT: 42 %
Neutro Abs: 2.4 10*3/uL (ref 1.7–7.7)
Platelets: 284 10*3/uL (ref 150–400)
RBC: 3.8 MIL/uL — AB (ref 3.87–5.11)
RDW: 12.5 % (ref 11.5–15.5)
WBC: 5.7 10*3/uL (ref 4.0–10.5)

## 2015-03-08 LAB — POC URINE PREG, ED: PREG TEST UR: NEGATIVE

## 2015-03-08 MED ORDER — MECLIZINE HCL 25 MG PO TABS: 25.0000 mg | ORAL_TABLET | Freq: Three times a day (TID) | ORAL | Status: DC | PRN

## 2015-03-08 NOTE — ED Provider Notes (Signed)
CSN: [id number]   Arrival date & time 03/08/15 2041  History  This chart was scribed for Geoffery Lyons, MD by Bethel Born, ED Scribe. This patient was seen in room A02C/A02C and the patient's care was started at 11:07 PM.  Chief Complaint  Patient presents with  . Weakness  . Dizziness    HPI The history is provided by the patient. No language interpreter was used.   [name] is a 34 y.o. female who presents to the Emergency Department complaining of an episode of light headedness with sudden onset today at the grocery store. Pt states that since then she feels dizzy ("drunk without the drinking") and notes that the sensation is exacerbated by leaning forward or standing up. She has had similar episodes in the past at work for the last few weeks. Today's episode lasted longer.  Pt denies  tinnitus, ear pain, numbness, tingling, and focal weakness.  No daily medication. No history of anemia or DM. 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   Past Medical History  Diagnosis Date  . Pneumothorax on left   . Hx of left breast biopsy   . Hx of cervical biopsy   . History of tobacco abuse   . Hx of gonorrhea   . Hx of chlamydia infection   . Hx of varicella   . Headache(784.0)     otc med prn    Past Surgical History  Procedure Laterality Date  . Insertion of left chest tube  01/20/11    Dr Donata Clay  . Biopsy of cervix    . Colposcopy vulva w/ biopsy      Normal results  . Video assisted thoracoscopy  08/24/2011    Procedure: VIDEO ASSISTED THORACOSCOPY;  Surgeon: Norton Blizzard, MD;  Location[facility] OR;  Service: Thoracic;  Laterality: Left;  Left Video assisted thorascopic surgery , RESECTION OF APICIAL BLEB WITH PLEURODESIS  . Breast biopsy  2005    cyst removed L breast  . Tubal ligation  07/05/2012    Procedure: POST PARTUM TUBAL LIGATION;  Surgeon: Mickel Baas, MD;  Location: [facility] ORS;  Service: Gynecology;  Laterality: Bilateral;  . Novasure  ablation N/A 04/06/2013    Procedure: NOVASURE ABLATION;  Surgeon: Levi Aland, MD;  Location: [facility] ORS;  Service: Gynecology;  Laterality: N/A;    Family History  Problem Relation Age of Onset  . Other Neg Hx   . Hypertension Paternal Grandmother     Social History  Substance Use Topics  . Smoking status: Former Smoker -- 0.50 packs/day    Types: Cigarettes    Quit date: 11/21/2010  . Smokeless tobacco: Never Used  . Alcohol Use: Yes     Comment: socially     Review of Systems 10 Systems reviewed and all are negative for acute change except as noted in the HPI. Home Medications   Prior to Admission medications   Medication Sig Start Date End Date Taking? Authorizing Taryn Shellhammer  benzonatate (TESSALON) 100 MG capsule Take 1 capsule (100 mg total) by mouth 3 (three) times daily as needed for cough. 01/01/14   Mathis Fare Presson, PA  ibuprofen (ADVIL,MOTRIN) 600 MG tablet Take 1 tablet (600 mg total) by mouth every 6 (six) hours as needed for pain. 11/20/12   Renne Crigler, PA-C  ipratropium (ATROVENT) 0.06 % nasal spray Place 2 sprays into both nostrils 4 (four) times daily. For nasal congestion 01/01/14   Ria Clock,  PA  Multiple Vitamin (MULTIVITAMIN WITH MINERALS) TABS tablet Take 1 tablet by mouth daily.    Historical Williemae Muriel, MD  oxyCODONE-acetaminophen (PERCOCET) 10-325 MG per tablet Take 1 tablet by mouth every 4 (four) hours as needed for pain. 04/06/13   Levi Aland, MD    Allergies  Penicillins  Triage Vitals: BP 98/66 mmHg  Pulse 70  Temp(Src) 98 F (36.7 C) (Oral)  Resp 16  Ht 5\' 9"  (1.753 m)  Wt 136 lb (61.689 kg)  BMI 20.07 kg/m2  SpO2 100%  Physical Exam  Constitutional: She is oriented to person, place, and time. She appears well-developed and well-nourished. No distress.  HENT:  Head: Normocephalic and atraumatic.  Eyes: EOM are normal. Pupils are equal, round, and reactive to light.  Neck: Normal range of motion.  Cardiovascular:  Normal rate, regular rhythm and normal heart sounds.   Pulmonary/Chest: Effort normal and breath sounds normal.  Abdominal: Soft. She exhibits no distension. There is no tenderness.  Musculoskeletal: Normal range of motion.  Neurological: She is alert and oriented to person, place, and time. No cranial nerve deficit. She exhibits normal muscle tone. Coordination normal.  Skin: Skin is warm and dry.  Psychiatric: She has a normal mood and affect. Judgment normal.  Nursing note and vitals reviewed.   ED Course  Procedures   DIAGNOSTIC STUDIES: Oxygen Saturation is 100% on RA, normal by my interpretation.    COORDINATION OF CARE: 11:12 PM Discussed treatment plan which includes lab work, EKG, and meclizine with pt at bedside and pt agreed to plan.  Labs Reviewed  CBC WITH DIFFERENTIAL/PLATELET - Abnormal; Notable for the following:    RBC 3.80 (*)    All other components within normal limits  BASIC METABOLIC PANEL  URINALYSIS, ROUTINE W REFLEX MICROSCOPIC (NOT AT [facility])  POC URINE PREG, ED    Imaging Review No results found.   I, Geoffery Lyons, MD, personally reviewed and evaluated these lab results as part of my medical decision-making.   EKG Interpretation  Date/Time:  Friday March 08 2015 21:11:36 EDT Ventricular Rate:  66 PR Interval:  148 QRS Duration: 82 QT Interval:  366 QTC Calculation: 383 R Axis:   87 Text Interpretation:  Normal sinus rhythm Normal ECG Confirmed by DELO  MD, DOUGLAS ([id number]) on 03/08/2015 11:06:43 PM    MDM   Final diagnoses:  None     Patient presents with complaints of dizziness. It is worse when she changes position. I suspect this is related to peripheral vertigo. She has no other neurologic symptoms and neurologic exam is nonfocal. She will be treated with meclizine and when necessary return.   I personally performed the services described in this documentation, which was scribed in my presence. The recorded information has been  reviewed and is accurate.     Geoffery Lyons, MD 03/08/15 [phone]

## 2015-03-08 NOTE — Discharge Instructions (Signed)
Meclizine as prescribed as needed for nausea.  Follow-up with your primary Dr. if not improving in the next week, and return to the ER if symptoms significantly worsen or change.   Benign Positional Vertigo Vertigo means you feel like you or your surroundings are moving when they are not. Benign positional vertigo is the most common form of vertigo. Benign means that the cause of your condition is not serious. Benign positional vertigo is more common in older adults. CAUSES  Benign positional vertigo is the result of an upset in the labyrinth system. This is an area in the middle ear that helps control your balance. This may be caused by a viral infection, head injury, or repetitive motion. However, often no specific cause is found. SYMPTOMS  Symptoms of benign positional vertigo occur when you move your head or eyes in different directions. Some of the symptoms may include:  Loss of balance and falls.  Vomiting.  Blurred vision.  Dizziness.  Nausea.  Involuntary eye movements (nystagmus). DIAGNOSIS  Benign positional vertigo is usually diagnosed by physical exam. If the specific cause of your benign positional vertigo is unknown, your caregiver may perform imaging tests, such as magnetic resonance imaging (MRI) or computed tomography (CT). TREATMENT  Your caregiver may recommend movements or procedures to correct the benign positional vertigo. Medicines such as meclizine, benzodiazepines, and medicines for nausea may be used to treat your symptoms. In rare cases, if your symptoms are caused by certain conditions that affect the inner ear, you may need surgery. HOME CARE INSTRUCTIONS   Follow your caregiver's instructions.  Move slowly. Do not make sudden body or head movements.  Avoid driving.  Avoid operating heavy machinery.  Avoid performing any tasks that would be dangerous to you or others during a vertigo episode.  Drink enough fluids to keep your urine clear or pale  yellow. SEEK IMMEDIATE MEDICAL CARE IF:   You develop problems with walking, weakness, numbness, or using your arms, hands, or legs.  You have difficulty speaking.  You develop severe headaches.  Your nausea or vomiting continues or gets worse.  You develop visual changes.  Your family or friends notice any behavioral changes.  Your condition gets worse.  You have a fever.  You develop a stiff neck or sensitivity to light. MAKE SURE YOU:   Understand these instructions.  Will watch your condition.  Will get help right away if you are not doing well or get worse. Document Released: 03/16/2006 Document Revised: 08/31/2011 Document Reviewed: 02/26/2011 [facility] Patient Information 2015 [place], [place]. This information is not intended to replace advice given to you by your health care provider. Make sure you discuss any questions you have with your health care provider.

## 2015-03-08 NOTE — ED Notes (Signed)
Patient gets more dizzy when she moves her head from side to side

## 2015-03-08 NOTE — ED Notes (Signed)
Patient stated when she stands up she feels dizzy and "weird".  Stated today at the store she started feeling dizzy, no weakness

## 2015-04-08 ENCOUNTER — Encounter (HOSPITAL_COMMUNITY): Payer: Self-pay | Admitting: *Deleted

## 2015-04-08 ENCOUNTER — Emergency Department (HOSPITAL_COMMUNITY)
Admission: EM | Admit: 2015-04-08 | Discharge: 2015-04-08 | Disposition: A | Discharge status: Home / Self Care | Payer: BLUE CROSS/BLUE SHIELD | Attending: Emergency Medicine | Admitting: Emergency Medicine

## 2015-04-08 DIAGNOSIS — Z87891 Personal history of nicotine dependence: Secondary | ICD-10-CM | POA: Insufficient documentation

## 2015-04-08 DIAGNOSIS — Z8709 Personal history of other diseases of the respiratory system: Secondary | ICD-10-CM | POA: Insufficient documentation

## 2015-04-08 DIAGNOSIS — Z880 Allergy status to penicillin: Secondary | ICD-10-CM | POA: Insufficient documentation

## 2015-04-08 DIAGNOSIS — R1010 Upper abdominal pain, unspecified: Secondary | ICD-10-CM | POA: Diagnosis present

## 2015-04-08 DIAGNOSIS — Z9889 Other specified postprocedural states: Secondary | ICD-10-CM | POA: Insufficient documentation

## 2015-04-08 DIAGNOSIS — Z3202 Encounter for pregnancy test, result negative: Secondary | ICD-10-CM | POA: Diagnosis not present

## 2015-04-08 DIAGNOSIS — R1013 Epigastric pain: Secondary | ICD-10-CM | POA: Diagnosis not present

## 2015-04-08 DIAGNOSIS — Z8619 Personal history of other infectious and parasitic diseases: Secondary | ICD-10-CM | POA: Diagnosis not present

## 2015-04-08 LAB — URINALYSIS, ROUTINE W REFLEX MICROSCOPIC
Bilirubin Urine: NEGATIVE
Glucose, UA: NEGATIVE mg/dL
HGB URINE DIPSTICK: NEGATIVE
Ketones, ur: NEGATIVE mg/dL
Nitrite: NEGATIVE
PH: 7 (ref 5.0–8.0)
PROTEIN: NEGATIVE mg/dL
Specific Gravity, Urine: 1.024 (ref 1.005–1.030)
Urobilinogen, UA: 1 mg/dL (ref 0.0–1.0)

## 2015-04-08 LAB — LIPASE, BLOOD: Lipase: 28 U/L (ref 22–51)

## 2015-04-08 LAB — CBC
HCT: 36.3 % (ref 36.0–46.0)
Hemoglobin: 12.4 g/dL (ref 12.0–15.0)
MCH: 32.6 pg (ref 26.0–34.0)
MCHC: 34.2 g/dL (ref 30.0–36.0)
MCV: 95.5 fL (ref 78.0–100.0)
Platelets: 278 10*3/uL (ref 150–400)
RBC: 3.8 MIL/uL — ABNORMAL LOW (ref 3.87–5.11)
RDW: 12.6 % (ref 11.5–15.5)
WBC: 6.9 10*3/uL (ref 4.0–10.5)

## 2015-04-08 LAB — COMPREHENSIVE METABOLIC PANEL
ALK PHOS: 45 U/L (ref 38–126)
ALT: 15 U/L (ref 14–54)
AST: 16 U/L (ref 15–41)
Albumin: 4.1 g/dL (ref 3.5–5.0)
Anion gap: 5 (ref 5–15)
BILIRUBIN TOTAL: 0.7 mg/dL (ref 0.3–1.2)
BUN: 16 mg/dL (ref 6–20)
CALCIUM: 9.3 mg/dL (ref 8.9–10.3)
CO2: 27 mmol/L (ref 22–32)
CREATININE: 0.82 mg/dL (ref 0.44–1.00)
Chloride: 105 mmol/L (ref 101–111)
GFR calc Af Amer: >60 mL/min (ref >60)
GLUCOSE: 109 mg/dL — AB (ref 65–99)
Potassium: 4 mmol/L (ref 3.5–5.1)
Sodium: 137 mmol/L (ref 135–145)
Total Protein: 7.7 g/dL (ref 6.5–8.1)

## 2015-04-08 LAB — POC URINE PREG, ED: Preg Test, Ur: NEGATIVE

## 2015-04-08 LAB — URINE MICROSCOPIC-ADD ON

## 2015-04-08 MED ORDER — ESOMEPRAZOLE MAGNESIUM 40 MG PO CPDR: 40.0000 mg | DELAYED_RELEASE_CAPSULE | Freq: Every day | ORAL | Status: DC

## 2015-04-08 NOTE — ED Notes (Signed)
Pt reports upper abd pain x3 days, reports increased pain when she eats. Reports nausea, denies vomiting. Pain 2/10.

## 2015-04-08 NOTE — Discharge Instructions (Signed)
Return to the ED with any concerns including vomiting and not able to keep down liquids, fever/chills, chest pain, difficulty breathing, decreased level of alertness/lethargy, or any other alarming symptoms

## 2015-04-08 NOTE — ED Provider Notes (Signed)
CSN: [id number]     Arrival date & time 04/08/15  1802 History   First MD Initiated Contact with Patient 04/08/15 2109     Chief Complaint  Patient presents with  . Abdominal Pain     (Consider location/radiation/quality/duration/timing/severity/associated sxs/prior Treatment) HPI  Pt presents with c/o upper abdominal pain.  Pain is sharp and worse after eating or drinking.  Pt states pain has been going on for the past 3 days.  No vomiting, has had some nausea.  No lower abodminal pain.  No dysuria/urgency/frequency.  No fever/chills.  She has not tried anything for her symptoms.  No shortness of breath.  There are no other associated systemic symptoms, there are no other alleviating or modifying factors.   Past Medical History  Diagnosis Date  . Pneumothorax on left   . Hx of left breast biopsy   . Hx of cervical biopsy   . History of tobacco abuse   . Hx of gonorrhea   . Hx of chlamydia infection   . Hx of varicella   . Headache(784.0)     otc med prn   Past Surgical History  Procedure Laterality Date  . Insertion of left chest tube  01/20/11    Dr Donata Clay  . Biopsy of cervix    . Colposcopy vulva w/ biopsy      Normal results  . Video assisted thoracoscopy  08/24/2011    Procedure: VIDEO ASSISTED THORACOSCOPY;  Surgeon: Norton Blizzard, MD;  Location[facility] OR;  Service: Thoracic;  Laterality: Left;  Left Video assisted thorascopic surgery , RESECTION OF APICIAL BLEB WITH PLEURODESIS  . Breast biopsy  2005    cyst removed L breast  . Tubal ligation  07/05/2012    Procedure: POST PARTUM TUBAL LIGATION;  Surgeon: Mickel Baas, MD;  Location: [facility] ORS;  Service: Gynecology;  Laterality: Bilateral;  . Novasure ablation N/A 04/06/2013    Procedure: NOVASURE ABLATION;  Surgeon: Levi Aland, MD;  Location: [facility] ORS;  Service: Gynecology;  Laterality: N/A;   Family History  Problem Relation Age of Onset  . Other Neg Hx   . Hypertension Paternal Grandmother    Social History   Substance Use Topics  . Smoking status: Former Smoker -- 0.50 packs/day    Types: Cigarettes    Quit date: 11/21/2010  . Smokeless tobacco: Never Used  . Alcohol Use: Yes     Comment: socially   OB History    Gravida Para Term Preterm AB TAB SAB Ectopic Multiple Living   Review of Systems  ROS reviewed and all otherwise negative except for mentioned in HPI    Allergies  Penicillins  Home Medications   Prior to Admission medications   Medication Sig Start Date End Date Taking? Authorizing Provider  benzonatate (TESSALON) 100 MG capsule Take 1 capsule (100 mg total) by mouth 3 (three) times daily as needed for cough. Patient not taking: Reported on 04/08/2015 01/01/14   Mathis Fare Presson, PA  esomeprazole (NEXIUM) 40 MG capsule Take 1 capsule (40 mg total) by mouth daily. 04/08/15   Jerelyn Scott, MD  ibuprofen (ADVIL,MOTRIN) 600 MG tablet Take 1 tablet (600 mg total) by mouth every 6 (six) hours as needed for pain. Patient not taking: Reported on 04/08/2015 11/20/12   Renne Crigler, PA-C  ipratropium (ATROVENT) 0.06 % nasal spray Place 2 sprays into both nostrils 4 (four) times daily. For  nasal congestion Patient not taking: Reported on 04/08/2015 01/01/14   Ria ClockJennifer Lee H Presson, PA  meclizine (ANTIVERT) 25 MG tablet Take 1 tablet (25 mg total) by mouth 3 (three) times daily as needed for dizziness. Patient not taking: Reported on 04/08/2015 03/08/15   Geoffery Lyonsouglas Delo, MD  oxyCODONE-acetaminophen (PERCOCET) 10-325 MG per tablet Take 1 tablet by mouth every 4 (four) hours as needed for pain. Patient not taking: Reported on 04/08/2015 04/06/13   Levi AlandMark E Anderson, MD   BP 109/70 mmHg  Pulse 59  Temp(Src) 97.9 F (36.6 C) (Oral)  Resp 18  Wt 130 lb (58.968 kg)  SpO2 100%  Vitals reviewed Physical Exam  Physical Examination: General appearance - alert, well appearing, and in no distress Mental status - alert, oriented to person, place, and time Eyes  No  conjunctival injection, no scleral icterus Mouth - mucous membranes moist, pharynx normal without lesions Chest - clear to auscultation, no wheezes, rales or rhonchi, symmetric air entry Heart - normal rate, regular rhythm, normal S1, S2, no murmurs, rubs, clicks or gallops Abdomen - soft, very mild epigastric tenderness to palpation, no rebound, no gaurding tenderness, nondistended, no masses or organomegaly Extremities - peripheral pulses normal, no pedal edema, no clubbing or cyanosis Skin - normal coloration and turgor, no rashes Neuro- awake, alert  ED Course  Procedures (including critical care time) Labs Review Labs Reviewed  COMPREHENSIVE METABOLIC PANEL - Abnormal; Notable for the following:    Glucose, Bld 109 (*)    All other components within normal limits  CBC - Abnormal; Notable for the following:    RBC 3.80 (*)    All other components within normal limits  URINALYSIS, ROUTINE W REFLEX MICROSCOPIC (NOT AT [facility]) - Abnormal; Notable for the following:    APPearance CLOUDY (*)    Leukocytes, UA SMALL (*)    All other components within normal limits  LIPASE, BLOOD  URINE MICROSCOPIC-ADD ON  POC URINE PREG, ED    Imaging Review No results found. I have personally reviewed and evaluated these images and lab results as part of my medical decision-making.   EKG Interpretation None      MDM   Final diagnoses:  Epigastric pain    Pt presenting with c/o epigastric pain. Pt has very mild epigastric tenderness.  Labs are reassuring.  Symptoms sound most c/w gastritis/GERD- will give a trial of nexium- d/w patient that if symptoms persist the next step will be an abdominal ultrasound on an outpatient basis.  Discharged with strict return precautions.  Pt agreeable with plan.    Jerelyn ScottMartha Linker, MD 04/09/15 [phone]

## 2015-10-03 DIAGNOSIS — B373 Candidiasis of vulva and vagina: Secondary | ICD-10-CM | POA: Diagnosis not present

## 2015-10-03 DIAGNOSIS — Z01419 Encounter for gynecological examination (general) (routine) without abnormal findings: Secondary | ICD-10-CM | POA: Diagnosis not present

## 2015-12-17 DIAGNOSIS — N898 Other specified noninflammatory disorders of vagina: Secondary | ICD-10-CM | POA: Diagnosis not present

## 2015-12-26 ENCOUNTER — Emergency Department (HOSPITAL_COMMUNITY)
Admission: EM | Admit: 2015-12-26 | Discharge: 2015-12-26 | Disposition: A | Discharge status: Home / Self Care | Payer: BLUE CROSS/BLUE SHIELD | Attending: Emergency Medicine | Admitting: Emergency Medicine

## 2015-12-26 ENCOUNTER — Encounter (HOSPITAL_COMMUNITY): Payer: Self-pay

## 2015-12-26 DIAGNOSIS — G43909 Migraine, unspecified, not intractable, without status migrainosus: Secondary | ICD-10-CM | POA: Insufficient documentation

## 2015-12-26 DIAGNOSIS — Z5321 Procedure and treatment not carried out due to patient leaving prior to being seen by health care provider: Secondary | ICD-10-CM | POA: Diagnosis not present

## 2015-12-26 MED ORDER — ONDANSETRON 4 MG PO TBDP
4.0000 mg | ORAL_TABLET | Freq: Once | ORAL | Status: AC | PRN
  Administered 2015-12-26: 4 mg via ORAL

## 2015-12-26 MED ORDER — ONDANSETRON 4 MG PO TBDP
ORAL_TABLET | ORAL | Status: AC
  Filled 2015-12-26: qty 1

## 2015-12-26 NOTE — ED Notes (Signed)
Pt. Called for 3x waiting room with no answer.

## 2015-12-26 NOTE — ED Notes (Addendum)
Per pt, pt woke up with migraine this morning. Went to work hoping it would go away. Pt reports migraine getting worse. Pt was sent home, took a nap, and was not able to get the migraine under control. Pt reports nausea with headache. Hx of the same in the past

## 2016-03-03 DIAGNOSIS — M4135 Thoracogenic scoliosis, thoracolumbar region: Secondary | ICD-10-CM | POA: Diagnosis not present

## 2016-03-03 DIAGNOSIS — G44201 Tension-type headache, unspecified, intractable: Secondary | ICD-10-CM | POA: Diagnosis not present

## 2016-03-03 DIAGNOSIS — M9902 Segmental and somatic dysfunction of thoracic region: Secondary | ICD-10-CM | POA: Diagnosis not present

## 2016-03-03 DIAGNOSIS — M9901 Segmental and somatic dysfunction of cervical region: Secondary | ICD-10-CM | POA: Diagnosis not present

## 2016-03-04 DIAGNOSIS — M9901 Segmental and somatic dysfunction of cervical region: Secondary | ICD-10-CM | POA: Diagnosis not present

## 2016-03-04 DIAGNOSIS — M9902 Segmental and somatic dysfunction of thoracic region: Secondary | ICD-10-CM | POA: Diagnosis not present

## 2016-03-04 DIAGNOSIS — G44201 Tension-type headache, unspecified, intractable: Secondary | ICD-10-CM | POA: Diagnosis not present

## 2016-03-04 DIAGNOSIS — M4135 Thoracogenic scoliosis, thoracolumbar region: Secondary | ICD-10-CM | POA: Diagnosis not present

## 2016-03-05 DIAGNOSIS — M9901 Segmental and somatic dysfunction of cervical region: Secondary | ICD-10-CM | POA: Diagnosis not present

## 2016-03-05 DIAGNOSIS — M4135 Thoracogenic scoliosis, thoracolumbar region: Secondary | ICD-10-CM | POA: Diagnosis not present

## 2016-03-05 DIAGNOSIS — G44201 Tension-type headache, unspecified, intractable: Secondary | ICD-10-CM | POA: Diagnosis not present

## 2016-03-05 DIAGNOSIS — M9902 Segmental and somatic dysfunction of thoracic region: Secondary | ICD-10-CM | POA: Diagnosis not present

## 2016-03-07 DIAGNOSIS — M9901 Segmental and somatic dysfunction of cervical region: Secondary | ICD-10-CM | POA: Diagnosis not present

## 2016-03-07 DIAGNOSIS — M4135 Thoracogenic scoliosis, thoracolumbar region: Secondary | ICD-10-CM | POA: Diagnosis not present

## 2016-03-07 DIAGNOSIS — G44201 Tension-type headache, unspecified, intractable: Secondary | ICD-10-CM | POA: Diagnosis not present

## 2016-03-07 DIAGNOSIS — M9902 Segmental and somatic dysfunction of thoracic region: Secondary | ICD-10-CM | POA: Diagnosis not present

## 2016-03-09 DIAGNOSIS — M9902 Segmental and somatic dysfunction of thoracic region: Secondary | ICD-10-CM | POA: Diagnosis not present

## 2016-03-09 DIAGNOSIS — M4135 Thoracogenic scoliosis, thoracolumbar region: Secondary | ICD-10-CM | POA: Diagnosis not present

## 2016-03-09 DIAGNOSIS — G44201 Tension-type headache, unspecified, intractable: Secondary | ICD-10-CM | POA: Diagnosis not present

## 2016-03-09 DIAGNOSIS — M9901 Segmental and somatic dysfunction of cervical region: Secondary | ICD-10-CM | POA: Diagnosis not present

## 2016-03-12 DIAGNOSIS — M4135 Thoracogenic scoliosis, thoracolumbar region: Secondary | ICD-10-CM | POA: Diagnosis not present

## 2016-03-12 DIAGNOSIS — G44201 Tension-type headache, unspecified, intractable: Secondary | ICD-10-CM | POA: Diagnosis not present

## 2016-03-12 DIAGNOSIS — M9902 Segmental and somatic dysfunction of thoracic region: Secondary | ICD-10-CM | POA: Diagnosis not present

## 2016-03-12 DIAGNOSIS — M9901 Segmental and somatic dysfunction of cervical region: Secondary | ICD-10-CM | POA: Diagnosis not present

## 2016-03-14 DIAGNOSIS — M4135 Thoracogenic scoliosis, thoracolumbar region: Secondary | ICD-10-CM | POA: Diagnosis not present

## 2016-03-14 DIAGNOSIS — M9901 Segmental and somatic dysfunction of cervical region: Secondary | ICD-10-CM | POA: Diagnosis not present

## 2016-03-14 DIAGNOSIS — G44201 Tension-type headache, unspecified, intractable: Secondary | ICD-10-CM | POA: Diagnosis not present

## 2016-03-14 DIAGNOSIS — M9902 Segmental and somatic dysfunction of thoracic region: Secondary | ICD-10-CM | POA: Diagnosis not present

## 2016-03-16 DIAGNOSIS — M9902 Segmental and somatic dysfunction of thoracic region: Secondary | ICD-10-CM | POA: Diagnosis not present

## 2016-03-16 DIAGNOSIS — M9901 Segmental and somatic dysfunction of cervical region: Secondary | ICD-10-CM | POA: Diagnosis not present

## 2016-03-16 DIAGNOSIS — M4135 Thoracogenic scoliosis, thoracolumbar region: Secondary | ICD-10-CM | POA: Diagnosis not present

## 2016-03-16 DIAGNOSIS — G44201 Tension-type headache, unspecified, intractable: Secondary | ICD-10-CM | POA: Diagnosis not present

## 2016-03-19 DIAGNOSIS — M4135 Thoracogenic scoliosis, thoracolumbar region: Secondary | ICD-10-CM | POA: Diagnosis not present

## 2016-03-19 DIAGNOSIS — M9901 Segmental and somatic dysfunction of cervical region: Secondary | ICD-10-CM | POA: Diagnosis not present

## 2016-03-19 DIAGNOSIS — M9902 Segmental and somatic dysfunction of thoracic region: Secondary | ICD-10-CM | POA: Diagnosis not present

## 2016-03-19 DIAGNOSIS — G44201 Tension-type headache, unspecified, intractable: Secondary | ICD-10-CM | POA: Diagnosis not present

## 2016-03-21 DIAGNOSIS — M9901 Segmental and somatic dysfunction of cervical region: Secondary | ICD-10-CM | POA: Diagnosis not present

## 2016-03-21 DIAGNOSIS — G44201 Tension-type headache, unspecified, intractable: Secondary | ICD-10-CM | POA: Diagnosis not present

## 2016-03-21 DIAGNOSIS — M4135 Thoracogenic scoliosis, thoracolumbar region: Secondary | ICD-10-CM | POA: Diagnosis not present

## 2016-03-21 DIAGNOSIS — M9902 Segmental and somatic dysfunction of thoracic region: Secondary | ICD-10-CM | POA: Diagnosis not present

## 2016-03-23 DIAGNOSIS — M9902 Segmental and somatic dysfunction of thoracic region: Secondary | ICD-10-CM | POA: Diagnosis not present

## 2016-03-23 DIAGNOSIS — M9901 Segmental and somatic dysfunction of cervical region: Secondary | ICD-10-CM | POA: Diagnosis not present

## 2016-03-23 DIAGNOSIS — M4135 Thoracogenic scoliosis, thoracolumbar region: Secondary | ICD-10-CM | POA: Diagnosis not present

## 2016-03-23 DIAGNOSIS — G44201 Tension-type headache, unspecified, intractable: Secondary | ICD-10-CM | POA: Diagnosis not present

## 2016-03-26 DIAGNOSIS — G44201 Tension-type headache, unspecified, intractable: Secondary | ICD-10-CM | POA: Diagnosis not present

## 2016-03-26 DIAGNOSIS — M9902 Segmental and somatic dysfunction of thoracic region: Secondary | ICD-10-CM | POA: Diagnosis not present

## 2016-03-26 DIAGNOSIS — M4135 Thoracogenic scoliosis, thoracolumbar region: Secondary | ICD-10-CM | POA: Diagnosis not present

## 2016-03-26 DIAGNOSIS — M9901 Segmental and somatic dysfunction of cervical region: Secondary | ICD-10-CM | POA: Diagnosis not present

## 2016-03-30 DIAGNOSIS — M4135 Thoracogenic scoliosis, thoracolumbar region: Secondary | ICD-10-CM | POA: Diagnosis not present

## 2016-03-30 DIAGNOSIS — M9902 Segmental and somatic dysfunction of thoracic region: Secondary | ICD-10-CM | POA: Diagnosis not present

## 2016-03-30 DIAGNOSIS — G44201 Tension-type headache, unspecified, intractable: Secondary | ICD-10-CM | POA: Diagnosis not present

## 2016-03-30 DIAGNOSIS — M9901 Segmental and somatic dysfunction of cervical region: Secondary | ICD-10-CM | POA: Diagnosis not present

## 2016-04-06 DIAGNOSIS — M4135 Thoracogenic scoliosis, thoracolumbar region: Secondary | ICD-10-CM | POA: Diagnosis not present

## 2016-04-06 DIAGNOSIS — M9902 Segmental and somatic dysfunction of thoracic region: Secondary | ICD-10-CM | POA: Diagnosis not present

## 2016-04-06 DIAGNOSIS — G44201 Tension-type headache, unspecified, intractable: Secondary | ICD-10-CM | POA: Diagnosis not present

## 2016-04-06 DIAGNOSIS — M9901 Segmental and somatic dysfunction of cervical region: Secondary | ICD-10-CM | POA: Diagnosis not present

## 2016-04-26 ENCOUNTER — Emergency Department (HOSPITAL_COMMUNITY)
Admission: EM | Admit: 2016-04-26 | Discharge: 2016-04-26 | Disposition: A | Discharge status: Home / Self Care | Payer: BLUE CROSS/BLUE SHIELD | Attending: Emergency Medicine | Admitting: Emergency Medicine

## 2016-04-26 ENCOUNTER — Encounter (HOSPITAL_COMMUNITY): Payer: Self-pay

## 2016-04-26 DIAGNOSIS — Z79899 Other long term (current) drug therapy: Secondary | ICD-10-CM | POA: Diagnosis not present

## 2016-04-26 DIAGNOSIS — J029 Acute pharyngitis, unspecified: Secondary | ICD-10-CM | POA: Insufficient documentation

## 2016-04-26 DIAGNOSIS — Z87891 Personal history of nicotine dependence: Secondary | ICD-10-CM | POA: Insufficient documentation

## 2016-04-26 LAB — RAPID STREP SCREEN (MED CTR MEBANE ONLY): STREPTOCOCCUS, GROUP A SCREEN (DIRECT): NEGATIVE

## 2016-04-26 NOTE — ED Provider Notes (Signed)
[facility]EMER[facility]ENCY DEPT Provider Note   CSN: [id number] Arrival date & time: 04/26/16  1847  By signing my name below, I, Teofilo PodMatthew P. Jamison, attest that this documentation has been prepared under the direction and in the presence of Arvilla MeresAshley Loomis Anacker, PA-C. Electronically Signed: Teofilo PodMatthew P. Jamison, ED Scribe. 04/26/2016. 10:19 PM.    History   Chief Complaint Chief Complaint  Patient presents with  . Sore Throat    The history is provided by the patient. No language interpreter was used.   HPI Comments:  [name] is a 35 y.o. female who presents to the Emergency Department complaining of a constant sore throat x 5 days. Pt describes the pain as "scratchy." Pt notes associated pain when swallowing. Pt states that she has been drinking honey in her tea and taking cough drops with no relief. Pt denies headache, cough, rash, abdominal pain, nausea, vomiting, diarrhea, rhinorrhea, ear pain, chills, SOB, or difficulty swallowing. She is managing her oral secretions.    Past Medical History:  Diagnosis Date  . Headache(784.0)    otc med prn  . History of tobacco abuse   . Hx of cervical biopsy   . Hx of chlamydia infection   . Hx of gonorrhea   . Hx of left breast biopsy   . Hx of varicella   . Pneumothorax on left     Patient Active Problem List   Diagnosis Date Noted  . LSIL (low grade squamous intraepithelial lesion) on Pap smear 07/27/2011  . Pneumothorax on left   . Hx of left breast biopsy   . Hx of cervical biopsy   . History of tobacco abuse     Past Surgical History:  Procedure Laterality Date  . biopsy of cervix    . BREAST BIOPSY  2005   cyst removed L breast  . COLPOSCOPY VULVA W/ BIOPSY     Normal results  . insertion of left chest tube  01/20/11   Dr Donata ClayVan Trigt  . NOVASURE ABLATION N/A 04/06/2013   Procedure: NOVASURE ABLATION;  Surgeon: Levi AlandMark E Anderson, MD;  Location: [facility];  Service: Gynecology;  Laterality: N/A;  . TUBAL LIGATION  07/05/2012   Procedure: POST PARTUM TUBAL LIGATION;  Surgeon: Mickel Baasichard D Kaplan, MD;  Location: [facility];  Service: Gynecology;  Laterality: Bilateral;  . VIDEO ASSISTED THORACOSCOPY  08/24/2011   Procedure: VIDEO ASSISTED THORACOSCOPY;  Surgeon: Norton Blizzard Patrick Burney, MD;  [facility];  Service: Thoracic;  Laterality: Left;  Left Video assisted thorascopic surgery , RESECTION OF APICIAL BLEB WITH PLEURODESIS    OB History    Gravida Para Term Preterm AB Living   2 2 2     2    SAB TAB Ectopic Multiple Live Births           2       Home Medications    Prior to Admission medications   Medication Sig Start Date End Date Taking? Authorizing Provider  benzonatate (TESSALON) 100 MG capsule Take 1 capsule (100 mg total) by mouth 3 (three) times daily as needed for cough. Patient not taking: Reported on 04/08/2015 01/01/14   Mathis FareJennifer Lee H Presson, PA  esomeprazole (NEXIUM) 40 MG capsule Take 1 capsule (40 mg total) by mouth daily. 04/08/15   Jerelyn ScottMartha Linker, MD  ibuprofen (ADVIL,MOTRIN) 600 MG tablet Take 1 tablet (600 mg total) by mouth every 6 (six) hours as needed for pain. Patient not taking: Reported on 04/08/2015 11/20/12   Renne CriglerJoshua Geiple, PA-C  ipratropium (  ATROVENT) 0.06 % nasal spray Place 2 sprays into both nostrils 4 (four) times daily. For nasal congestion Patient not taking: Reported on 04/08/2015 01/01/14   Ria ClockJennifer Lee H Presson, PA  meclizine (ANTIVERT) 25 MG tablet Take 1 tablet (25 mg total) by mouth 3 (three) times daily as needed for dizziness. Patient not taking: Reported on 04/08/2015 03/08/15   Geoffery Lyonsouglas Delo, MD  oxyCODONE-acetaminophen (PERCOCET) 10-325 MG per tablet Take 1 tablet by mouth every 4 (four) hours as needed for pain. Patient not taking: Reported on 04/08/2015 04/06/13   Levi AlandMark E Anderson, MD    Family History Family History  Problem Relation Age of Onset  . Hypertension Paternal Grandmother   . Other Neg Hx     Social History Social History  Substance Use Topics  . Smoking  status: Former Smoker    Packs/day: 0.50    Types: Cigarettes    Quit date: 11/21/2010  . Smokeless tobacco: Never Used  . Alcohol use Yes     Comment: socially     Allergies   Penicillins   Review of Systems Review of Systems  Constitutional: Negative for chills and fever.  HENT: Positive for congestion, sore throat and trouble swallowing. Negative for ear pain and rhinorrhea.   Eyes: Negative for discharge.  Respiratory: Negative for cough and shortness of breath.   Gastrointestinal: Negative for abdominal pain, diarrhea, nausea and vomiting.  Skin: Negative for rash.  Neurological: Negative for headaches.     Physical Exam Updated Vital Signs BP 106/67   Pulse (!) 123   Temp 98.8 F (37.1 C)   Resp 16   Ht 5\' 9"  (1.753 m)   Wt 59.9 kg   SpO2 100%   BMI 19.49 kg/m   Physical Exam  Constitutional: She appears well-developed and well-nourished. No distress.  HENT:  Head: Normocephalic and atraumatic.  Right Ear: Tympanic membrane, external ear and ear canal normal.  Left Ear: Tympanic membrane, external ear and ear canal normal.  Nose: Mucosal edema present. Right sinus exhibits no maxillary sinus tenderness and no frontal sinus tenderness. Left sinus exhibits no maxillary sinus tenderness and no frontal sinus tenderness.  Mouth/Throat: Uvula is midline and mucous membranes are normal. No trismus in the jaw. No uvula swelling. Posterior oropharyngeal erythema present. No tonsillar exudate.  Nasal mucosal edema with pallor. No trismus; uvula midline; no tonsillar exudate or hypertrophy; mild posterior oropharynx erythema; no oral swelling; managing oral secretions.    Eyes: Conjunctivae are normal. Pupils are equal, round, and reactive to light. Right eye exhibits no discharge. Left eye exhibits no discharge.  Neck: Normal range of motion and phonation normal. Neck supple. No neck rigidity.  No nuchal rigidity.  Cardiovascular: Normal rate, regular rhythm, normal heart  sounds and intact distal pulses.  Exam reveals no gallop and no friction rub.   No murmur heard. Rate 60bpm on my assessment  Pulmonary/Chest: Effort normal and breath sounds normal. No stridor. No respiratory distress. She has no wheezes. She has no rales.  Abdominal: Soft. There is no tenderness.  Musculoskeletal: She exhibits no edema.  Lymphadenopathy:    She has no cervical adenopathy.  Neurological: She is alert. Coordination normal.  Skin: Skin is warm and dry. No rash noted. She is not diaphoretic. No erythema. No pallor.  Psychiatric: She has a normal mood and affect. Her behavior is normal.  Nursing note and vitals reviewed.    ED Treatments / Results  DIAGNOSTIC STUDIES:  Oxygen Saturation is 100% on RA,  normal by my interpretation.    COORDINATION OF CARE:  10:19 PM Discussed treatment plan with pt at bedside and pt agreed to plan.   Labs (all labs ordered are listed, but only abnormal results are displayed) Labs Reviewed  RAPID STREP SCREEN (NOT AT [facility])  CULTURE, GROUP A STREP [facility])    EKG  EKG Interpretation None       Radiology No results found.  Procedures Procedures (including critical care time)  Medications Ordered in ED Medications - No data to display   Initial Impression / Assessment and Plan / ED Course  I have reviewed the triage vital signs and the nursing notes.  Pertinent labs & imaging results that were available during my care of the patient were reviewed by me and considered in my medical decision making (see chart for details).  Clinical Course     Patient presents to ED with complaint of sore throat x 5 days. Patient is afebrile and non-toxic appearing in NAD. VSS. Mild posterior oropharynx erythema; no trismus; uvula midline; no oral swelling; no tonsillar hypertrophy or exudate; no nuchal rigidity; managing oral secretions. Rapid strep negative. Low suspicion for deep space infection or PTA. Suspect ?viral pharyngitis vs.  ?new onset URI with recent nasal congestion. Culture for strep sent. Discussed results and plan with patient. Symptomatic management discussed. Follow up with PCP if sxs persist, recourses provided for establishing PCP. Return precautions given. Pt voiced understanding and is agreeable.    Final Clinical Impressions(s) / ED Diagnoses   Final diagnoses:  Pharyngitis, unspecified etiology    New Prescriptions New Prescriptions   No medications on file  I personally performed the services described in this documentation, which was scribed in my presence. The recorded information has been reviewed and is accurate.     Lona Kettle, PA-C 04/26/16 2220    Benjiman Core, MD 04/27/16 [phone]

## 2016-04-26 NOTE — ED Triage Notes (Signed)
Pt c/o sore throat since Wednesday. Denies N/V/F/Chills. No SOB. Airway intact. A&Ox4.

## 2016-04-26 NOTE — Discharge Instructions (Signed)
Read the information below.  Your rapid strep was negative. It is being sent for culture, if positive you will be notified and started on antibiotics.  Continue symptomatic management - tylenol/motrin for pain, OTC cepacol drops, warm liquids such as tea and soup, you can try gargling with warm salt water as well.  Stay well hydrated, get plenty of rest, and wash hands frequently.  There is a contact number in your paperwork you can call to establish a primary provider, I encourage you to establish a regular doctor.  You may return to the Emergency Department at any time for worsening condition or any new symptoms that concern you. Return to ED if develop trouble swallowing, trouble breathing, neck swelling, neck stiffness, oral swelling, or any other new/concerning symptoms.

## 2016-04-29 LAB — CULTURE, GROUP A STREP (THRC)

## 2016-06-09 ENCOUNTER — Other Ambulatory Visit: Payer: Self-pay | Admitting: Obstetrics and Gynecology

## 2016-06-09 DIAGNOSIS — Z124 Encounter for screening for malignant neoplasm of cervix: Secondary | ICD-10-CM | POA: Diagnosis not present

## 2016-06-09 DIAGNOSIS — Z01419 Encounter for gynecological examination (general) (routine) without abnormal findings: Secondary | ICD-10-CM | POA: Diagnosis not present

## 2016-06-09 DIAGNOSIS — Z3202 Encounter for pregnancy test, result negative: Secondary | ICD-10-CM | POA: Diagnosis not present

## 2016-06-09 DIAGNOSIS — Z6820 Body mass index (BMI) 20.0-20.9, adult: Secondary | ICD-10-CM | POA: Diagnosis not present

## 2016-06-10 LAB — CYTOLOGY - PAP

## 2016-06-16 DIAGNOSIS — Z3042 Encounter for surveillance of injectable contraceptive: Secondary | ICD-10-CM | POA: Diagnosis not present

## 2016-06-16 DIAGNOSIS — Z6820 Body mass index (BMI) 20.0-20.9, adult: Secondary | ICD-10-CM | POA: Diagnosis not present

## 2016-10-02 ENCOUNTER — Other Ambulatory Visit: Payer: Self-pay | Admitting: Obstetrics and Gynecology

## 2016-10-02 DIAGNOSIS — N644 Mastodynia: Secondary | ICD-10-CM

## 2016-10-06 ENCOUNTER — Ambulatory Visit
Admission: RE | Admit: 2016-10-06 | Discharge: 2016-10-06 | Disposition: A | Discharge status: Home / Self Care | Payer: BLUE CROSS/BLUE SHIELD | Source: Ambulatory Visit | Attending: Obstetrics and Gynecology | Admitting: Obstetrics and Gynecology

## 2016-10-06 DIAGNOSIS — N644 Mastodynia: Secondary | ICD-10-CM

## 2016-10-06 DIAGNOSIS — R928 Other abnormal and inconclusive findings on diagnostic imaging of breast: Secondary | ICD-10-CM | POA: Diagnosis not present

## 2016-12-14 DIAGNOSIS — Z6820 Body mass index (BMI) 20.0-20.9, adult: Secondary | ICD-10-CM | POA: Diagnosis not present

## 2016-12-14 DIAGNOSIS — B373 Candidiasis of vulva and vagina: Secondary | ICD-10-CM | POA: Diagnosis not present

## 2017-03-10 ENCOUNTER — Ambulatory Visit (HOSPITAL_COMMUNITY)
Admission: EM | Admit: 2017-03-10 | Discharge: 2017-03-10 | Disposition: A | Discharge status: Home / Self Care | Payer: BLUE CROSS/BLUE SHIELD | Attending: Family Medicine | Admitting: Family Medicine

## 2017-03-10 ENCOUNTER — Encounter (HOSPITAL_COMMUNITY): Payer: Self-pay | Admitting: Family Medicine

## 2017-03-10 DIAGNOSIS — J00 Acute nasopharyngitis [common cold]: Secondary | ICD-10-CM | POA: Diagnosis not present

## 2017-03-10 DIAGNOSIS — T148XXA Other injury of unspecified body region, initial encounter: Secondary | ICD-10-CM

## 2017-03-10 MED ORDER — FLUTICASONE PROPIONATE 50 MCG/ACT NA SUSP: 2.0000 | Freq: Every day | NASAL | 0 refills | Status: DC

## 2017-03-10 MED ORDER — CETIRIZINE-PSEUDOEPHEDRINE ER 5-120 MG PO TB12: 1.0000 | ORAL_TABLET | Freq: Every day | ORAL | 0 refills | Status: DC

## 2017-03-10 MED ORDER — NAPROXEN 500 MG PO TABS: 500.0000 mg | ORAL_TABLET | Freq: Two times a day (BID) | ORAL | 0 refills | Status: AC

## 2017-03-10 NOTE — ED Provider Notes (Signed)
[facility]    CSN: [id number] Arrival date & time: 03/10/17  1829     History   Chief Complaint Chief Complaint  Patient presents with  . URI  . Neck Pain    HPI [name] is a 36 y.o. female.   36 year old female with history of sinus pressure, nasal congestion for 2 days. Today, developed neck pain and back pain, that is constant, and exacerbated with movement. Denies cough, ear pain, eye pain. Constant headache frontal and occipital, pressure feeling. Denies nausea, vomiting, photophobia, phonophobia. Denies fever, chills. Positive for night sweats. Has some weakness, but denies dizziness, syncope. Took nasal decongestant, benadryl, motrin with minimal relief. Denies abdominal pain, diarrhea, constipation. Denies chest pain, shortness of breath, wheezing. No sick contact. Denies history of seasonal allergies, asthma. Former smoker, quit 7 years ago, 2 pack year history.       Past Medical History:  Diagnosis Date  . Headache(784.0)    otc med prn  . History of tobacco abuse   . Hx of cervical biopsy   . Hx of chlamydia infection   . Hx of gonorrhea   . Hx of left breast biopsy   . Hx of varicella   . Pneumothorax on left     Patient Active Problem List   Diagnosis Date Noted  . LSIL (low grade squamous intraepithelial lesion) on Pap smear 07/27/2011  . Pneumothorax on left   . Hx of left breast biopsy   . Hx of cervical biopsy   . History of tobacco abuse     Past Surgical History:  Procedure Laterality Date  . biopsy of cervix    . BREAST BIOPSY  2005   cyst removed L breast  . COLPOSCOPY VULVA W/ BIOPSY     Normal results  . insertion of left chest tube  01/20/11   Dr Donata Clay  . NOVASURE ABLATION N/A 04/06/2013   Procedure: NOVASURE ABLATION;  Surgeon: Levi Aland, MD;  Location: [facility];  Service: Gynecology;  Laterality: N/A;  . TUBAL LIGATION  07/05/2012   Procedure: POST PARTUM TUBAL LIGATION;  Surgeon: Mickel Baas, MD;   Location: [facility];  Service: Gynecology;  Laterality: Bilateral;  . VIDEO ASSISTED THORACOSCOPY  08/24/2011   Procedure: VIDEO ASSISTED THORACOSCOPY;  Surgeon: Norton Blizzard, MD;  Location: [facility];  Service: Thoracic;  Laterality: Left;  Left Video assisted thorascopic surgery , RESECTION OF APICIAL BLEB WITH PLEURODESIS    OB History    Gravida Para Term Preterm AB Living   SAB TAB Ectopic Multiple Live Births           2       Home Medications    Prior to Admission medications   Medication Sig Start Date End Date Taking? Authorizing Provider  benzonatate (TESSALON) 100 MG capsule Take 1 capsule (100 mg total) by mouth 3 (three) times daily as needed for cough. Patient not taking: Reported on 04/08/2015 01/01/14   Ria Clock, PA  cetirizine-pseudoephedrine (ZYRTEC-D) 5-120 MG tablet Take 1 tablet by mouth daily. 03/10/17   Cathie Hoops, Amy V, PA-C  esomeprazole (NEXIUM) 40 MG capsule Take 1 capsule (40 mg total) by mouth daily. 04/08/15   Mabe, Latanya Maudlin, MD  fluticasone (FLONASE) 50 MCG/ACT nasal spray Place 2 sprays into both nostrils daily. 03/10/17   Cathie Hoops, Amy V, PA-C  ibuprofen (ADVIL,MOTRIN) 600 MG tablet Take 1 tablet (  600 mg total) by mouth every 6 (six) hours as needed for pain. Patient not taking: Reported on 04/08/2015 11/20/12   Renne Crigler, PA-C  ipratropium (ATROVENT) 0.06 % nasal spray Place 2 sprays into both nostrils 4 (four) times daily. For nasal congestion Patient not taking: Reported on 04/08/2015 01/01/14   Presson, Mathis Fare, PA  meclizine (ANTIVERT) 25 MG tablet Take 1 tablet (25 mg total) by mouth 3 (three) times daily as needed for dizziness. Patient not taking: Reported on 04/08/2015 03/08/15   Geoffery Lyons, MD  naproxen (NAPROSYN) 500 MG tablet Take 1 tablet (500 mg total) by mouth 2 (two) times daily. 03/10/17 03/20/17  Belinda Fisher, PA-C  oxyCODONE-acetaminophen (PERCOCET) 10-325 MG per tablet Take 1 tablet by mouth every 4 (four) hours as  needed for pain. Patient not taking: Reported on 04/08/2015 04/06/13   Levi Aland, MD    Family History Family History  Problem Relation Age of Onset  . Hypertension Paternal Grandmother   . Other Neg Hx     Social History Social History  Substance Use Topics  . Smoking status: Former Smoker    Packs/day: 0.50    Types: Cigarettes    Quit date: 11/21/2010  . Smokeless tobacco: Never Used  . Alcohol use Yes     Comment: socially     Allergies   Penicillins   Review of Systems Review of Systems  Reason unable to perform ROS: See HPI as above.     Physical Exam Triage Vital Signs ED Triage Vitals [03/10/17 1928]  Enc Vitals Group     BP 104/63     Pulse Rate 73     Resp 18     Temp 98.1 F (36.7 C)     Temp src      SpO2 100 %     Weight      Height      Head Circumference      Peak Flow      Pain Score 8     Pain Loc      Pain Edu?      Excl. in GC?    No data found.   Updated Vital Signs BP 104/63   Pulse 73   Temp 98.1 F (36.7 C)   Resp 18   SpO2 100%   Physical Exam  Constitutional: She is oriented to person, place, and time. She appears well-developed and well-nourished. No distress.  HENT:  Head: Normocephalic and atraumatic.  Right Ear: External ear and ear canal normal. Tympanic membrane is injected. Tympanic membrane is not erythematous and not bulging.  Left Ear: External ear and ear canal normal. Tympanic membrane is injected. Tympanic membrane is not erythematous and not bulging.  Nose: Mucosal edema and rhinorrhea present. Right sinus exhibits no maxillary sinus tenderness and no frontal sinus tenderness. Left sinus exhibits no maxillary sinus tenderness and no frontal sinus tenderness.  Mouth/Throat: Uvula is midline, oropharynx is clear and moist and mucous membranes are normal.  Eyes: Pupils are equal, round, and reactive to light. Conjunctivae are normal.  Neck: Normal range of motion. Neck supple. Muscular tenderness  present. No spinous process tenderness present. Normal range of motion present. No Brudzinski's sign and no Kernig's sign noted.  Cardiovascular: Normal rate, regular rhythm and normal heart sounds.  Exam reveals no gallop and no friction rub.   No murmur heard. Pulmonary/Chest: Effort normal and breath sounds normal. She has no decreased breath sounds. She has no wheezes. She  has no rhonchi. She has no rales.  Musculoskeletal:  Diffuse tenderness on palpation of shoulders and back. Full range of motion. Strength normal and equal bilaterally. Sensation normal and equal bilaterally.  Lymphadenopathy:    She has no cervical adenopathy.  Neurological: She is alert and oriented to person, place, and time. She has normal strength. She is not disoriented. No cranial nerve deficit or sensory deficit. She displays a negative Romberg sign. Coordination and gait normal. GCS eye subscore is 4. GCS verbal subscore is 5. GCS motor subscore is 6.  Skin: Skin is warm and dry.  Psychiatric: She has a normal mood and affect. Her behavior is normal. Judgment normal.     UC Treatments / Results  Labs (all labs ordered are listed, but only abnormal results are displayed) Labs Reviewed - No data to display  EKG  EKG Interpretation None       Radiology No results found.  Procedures Procedures (including critical care time)  Medications Ordered in UC Medications - No data to display   Initial Impression / Assessment and Plan / UC Course  I have reviewed the triage vital signs and the nursing notes.  Pertinent labs & imaging results that were available during my care of the patient were reviewed by me and considered in my medical decision making (see chart for details).    Discussed with patient history and exam most consistent with viral URI. Symptomatic treatment as needed. Push fluids. Patient with full ROM of neck, no photophobia, nausea, vomiting, fever, low suspicion for meningitis right now.  Return precautions given.   Discussed case with Dr Tracie Harrier, who agrees to plan.   Final Clinical Impressions(s) / UC Diagnoses   Final diagnoses:  Acute nasopharyngitis  Muscle strain    New Prescriptions Discharge Medication List as of 03/10/2017  8:07 PM    START taking these medications   Details  cetirizine-pseudoephedrine (ZYRTEC-D) 5-120 MG tablet Take 1 tablet by mouth daily., Starting Wed 03/10/2017, Normal    fluticasone (FLONASE) 50 MCG/ACT nasal spray Place 2 sprays into both nostrils daily., Starting Wed 03/10/2017, Normal    naproxen (NAPROSYN) 500 MG tablet Take 1 tablet (500 mg total) by mouth 2 (two) times daily., Starting Wed 03/10/2017, Until Sat 03/20/2017, Normal         Linward Headland V, PA-C 03/10/17 2241

## 2017-03-10 NOTE — ED Triage Notes (Signed)
Pt herr for URI symptoms and head and neck pain.

## 2017-03-10 NOTE — Discharge Instructions (Addendum)
Start flonase, zyrtec-D for nasal congestion. You can use over the counter nasal saline rinse such as neti pot for nasal congestion. Naproxen as directed for headache and muscle strain. Keep hydrated, your urine should be clear to pale yellow in color. Tylenol/motrin for fever and pain. Monitor for any worsening of symptoms, chest pain, shortness of breath, wheezing, swelling of the throat, follow up for reevaluation. If experiencing worsening headache, nausea/vomiting, sensitivity to light, confusion/mental status change, cannot move neck, go to the emergency department for further evaluation.

## 2017-04-12 DIAGNOSIS — R300 Dysuria: Secondary | ICD-10-CM | POA: Diagnosis not present

## 2017-04-12 DIAGNOSIS — Z6821 Body mass index (BMI) 21.0-21.9, adult: Secondary | ICD-10-CM | POA: Diagnosis not present

## 2017-04-12 DIAGNOSIS — N390 Urinary tract infection, site not specified: Secondary | ICD-10-CM | POA: Diagnosis not present

## 2017-07-23 ENCOUNTER — Emergency Department (HOSPITAL_COMMUNITY): Payer: BLUE CROSS/BLUE SHIELD

## 2017-07-23 ENCOUNTER — Emergency Department (HOSPITAL_COMMUNITY)
Admission: EM | Admit: 2017-07-23 | Discharge: 2017-07-23 | Disposition: A | Discharge status: Home / Self Care | Payer: BLUE CROSS/BLUE SHIELD | Attending: Emergency Medicine | Admitting: Emergency Medicine

## 2017-07-23 ENCOUNTER — Other Ambulatory Visit: Payer: Self-pay

## 2017-07-23 ENCOUNTER — Encounter (HOSPITAL_COMMUNITY): Payer: Self-pay

## 2017-07-23 DIAGNOSIS — W208XXA Other cause of strike by thrown, projected or falling object, initial encounter: Secondary | ICD-10-CM | POA: Insufficient documentation

## 2017-07-23 DIAGNOSIS — S9032XA Contusion of left foot, initial encounter: Secondary | ICD-10-CM

## 2017-07-23 DIAGNOSIS — Z87891 Personal history of nicotine dependence: Secondary | ICD-10-CM | POA: Insufficient documentation

## 2017-07-23 DIAGNOSIS — Y929 Unspecified place or not applicable: Secondary | ICD-10-CM | POA: Diagnosis not present

## 2017-07-23 DIAGNOSIS — Y999 Unspecified external cause status: Secondary | ICD-10-CM | POA: Diagnosis not present

## 2017-07-23 DIAGNOSIS — S99922A Unspecified injury of left foot, initial encounter: Secondary | ICD-10-CM | POA: Diagnosis not present

## 2017-07-23 DIAGNOSIS — M79672 Pain in left foot: Secondary | ICD-10-CM | POA: Diagnosis not present

## 2017-07-23 DIAGNOSIS — Y939 Activity, unspecified: Secondary | ICD-10-CM | POA: Diagnosis not present

## 2017-07-23 MED ORDER — IBUPROFEN 200 MG PO TABS
600.0000 mg | ORAL_TABLET | Freq: Once | ORAL | Status: AC
  Administered 2017-07-23: 600 mg via ORAL
  Filled 2017-07-23: qty 3

## 2017-07-23 MED ORDER — NAPROXEN 375 MG PO TABS: 375.0000 mg | ORAL_TABLET | Freq: Two times a day (BID) | ORAL | 0 refills | Status: DC

## 2017-07-23 NOTE — ED Triage Notes (Signed)
Patient states a hover board fell on her left foot this AM, but after being on her feet today, she has increased pain especially with weight bearing.

## 2017-07-23 NOTE — ED Notes (Signed)
PT DISCHARGED. INSTRUCTIONS AND PRESCRIPTION GIVEN. AAOX4. PT IN NO APPARENT DISTRESS WITH MODERATE PAIN. THE OPPORTUNITY TO ASK QUESTIONS WAS PROVIDED. 

## 2017-07-23 NOTE — ED Provider Notes (Signed)
[facility]-EMERGENCY DEPT Provider Note   CSN: [id number] Arrival date & time: 07/23/17  1614     History   Chief Complaint Chief Complaint  Patient presents with  . Foot Injury    HPI [name] is a 37 y.o. female who presents to the ED with c/o foot pain. Patient reports that a hover board fell on her left foot this morning and after being on her feet all day the pain has increased. The pain is worse with weight bearing.  HPI  Past Medical History:  Diagnosis Date  . Headache(784.0)    otc med prn  . History of tobacco abuse   . Hx of cervical biopsy   . Hx of chlamydia infection   . Hx of gonorrhea   . Hx of left breast biopsy   . Hx of varicella   . Pneumothorax on left     Patient Active Problem List   Diagnosis Date Noted  . LSIL (low grade squamous intraepithelial lesion) on Pap smear 07/27/2011  . Pneumothorax on left   . Hx of left breast biopsy   . Hx of cervical biopsy   . History of tobacco abuse     Past Surgical History:  Procedure Laterality Date  . biopsy of cervix    . BREAST BIOPSY  2005   cyst removed L breast  . COLPOSCOPY VULVA W/ BIOPSY     Normal results  . insertion of left chest tube  01/20/11   Dr Donata Clay  . NOVASURE ABLATION N/A 04/06/2013   Procedure: NOVASURE ABLATION;  Surgeon: Levi Aland, MD;  Location: [facility];  Service: Gynecology;  Laterality: N/A;  . TUBAL LIGATION  07/05/2012   Procedure: POST PARTUM TUBAL LIGATION;  Surgeon: Mickel Baas, MD;  Location: [facility];  Service: Gynecology;  Laterality: Bilateral;  . VIDEO ASSISTED THORACOSCOPY  08/24/2011   Procedure: VIDEO ASSISTED THORACOSCOPY;  Surgeon: Norton Blizzard, MD;  Location: [facility] OR;  Service: Thoracic;  Laterality: Left;  Left Video assisted thorascopic surgery , RESECTION OF APICIAL BLEB WITH PLEURODESIS    OB History    Gravida Para Term Preterm AB Living   2 2 2     2    SAB TAB Ectopic Multiple Live Births           2        Home Medications    Prior to Admission medications   Medication Sig Start Date End Date Taking? Authorizing Provider  cetirizine-pseudoephedrine (ZYRTEC-D) 5-120 MG tablet Take 1 tablet by mouth daily. 03/10/17  Yes Yu, Amy V, PA-C  benzonatate (TESSALON) 100 MG capsule Take 1 capsule (100 mg total) by mouth 3 (three) times daily as needed for cough. Patient not taking: Reported on 04/08/2015 01/01/14   Ria Clock, PA  esomeprazole (NEXIUM) 40 MG capsule Take 1 capsule (40 mg total) by mouth daily. Patient not taking: Reported on 07/23/2017 04/08/15   Phillis Haggis, MD  fluticasone [facility]) 50 MCG/ACT nasal spray Place 2 sprays into both nostrils daily. Patient not taking: Reported on 07/23/2017 03/10/17   Belinda Fisher, PA-C  ibuprofen (ADVIL,MOTRIN) 600 MG tablet Take 1 tablet (600 mg total) by mouth every 6 (six) hours as needed for pain. Patient not taking: Reported on 04/08/2015 11/20/12   Renne Crigler, PA-C  ipratropium (ATROVENT) 0.06 % nasal spray Place 2 sprays into both nostrils 4 (four) times daily. For nasal congestion Patient not taking: Reported on 04/08/2015  01/01/14   Presson, Mathis Fare, PA  meclizine (ANTIVERT) 25 MG tablet Take 1 tablet (25 mg total) by mouth 3 (three) times daily as needed for dizziness. Patient not taking: Reported on 04/08/2015 03/08/15   Geoffery Lyons, MD  naproxen (NAPROSYN) 375 MG tablet Take 1 tablet (375 mg total) by mouth 2 (two) times daily. 07/23/17   Janne Napoleon, NP  oxyCODONE-acetaminophen (PERCOCET) 10-325 MG per tablet Take 1 tablet by mouth every 4 (four) hours as needed for pain. Patient not taking: Reported on 04/08/2015 04/06/13   Levi Aland, MD  iron polysaccharides (NIFEREX) 150 MG capsule Take 1 capsule (150 mg total) by mouth daily. For one month then stop. 08/26/11 09/16/11  Ardelle Balls, PA-C    Family History Family History  Problem Relation Age of Onset  . Hypertension Paternal Grandmother   . Other  Neg Hx     Social History Social History   Tobacco Use  . Smoking status: Former Smoker    Packs/day: 0.50    Types: Cigarettes    Last attempt to quit: 11/21/2010    Years since quitting: 6.6  . Smokeless tobacco: Never Used  Substance Use Topics  . Alcohol use: Yes    Comment: socially  . Drug use: No     Allergies   Penicillins   Review of Systems Review of Systems  Musculoskeletal: Positive for arthralgias.       Foot pain  All other systems reviewed and are negative.    Physical Exam Updated Vital Signs BP 110/65 (BP Location: Right Arm)   Pulse 65   Temp 98.2 F (36.8 C) (Oral)   Resp 16   Ht 5\' 9"  (1.753 m)   Wt 61.7 kg (136 lb)   SpO2 100%   BMI 20.08 kg/m   Physical Exam  Constitutional: She is oriented to person, place, and time. She appears well-developed and well-nourished. No distress.  HENT:  Head: Atraumatic.  Eyes: EOM are normal.  Neck: Neck supple.  Cardiovascular: Normal rate.  Pulmonary/Chest: Effort normal.  Musculoskeletal: Normal range of motion.       Left foot: There is tenderness. There is normal range of motion, no deformity and no laceration. Swelling: minimal.  Pedal pulses 2+, adequate circulation. Tender with palpation over the dorsum of the left foot.   Neurological: She is alert and oriented to person, place, and time. No cranial nerve deficit.  Skin: Skin is warm and dry.  Psychiatric: She has a normal mood and affect.  Nursing note and vitals reviewed.    ED Treatments / Results  Labs (all labs ordered are listed, but only abnormal results are displayed) Labs Reviewed - No data to display  EKG Radiology Dg Foot Complete Left  Result Date: 07/23/2017 CLINICAL DATA:  Left medial foot pain. EXAM: LEFT FOOT - COMPLETE 3+ VIEW COMPARISON:  None. FINDINGS: There is no evidence of fracture or dislocation. There is no evidence of arthropathy or other focal bone abnormality. Soft tissues are unremarkable. IMPRESSION:  Negative. Electronically Signed   By: Ted Mcalpine M.D.   On: 07/23/2017 19:18    Procedures Procedures (including critical care time)  Medications Ordered in ED Medications  ibuprofen (ADVIL,MOTRIN) tablet 600 mg (600 mg Oral Given 07/23/17 1654)     Initial Impression / Assessment and Plan / ED Course  I have reviewed the triage vital signs and the nursing notes. 37 y.o. female with pain to the dorsum of the left foot s/p  injury earlier today stable for d/c without fracture or dislocation noted on x-ray and no focal neuro deficits. Ace wrap given to patient in the ED, ice, elevation and f/u with PCP. Return precautions discussed. Will treat with Naprosyn for pain and inflammation. Patient agrees with plan.   Final Clinical Impressions(s) / ED Diagnoses   Final diagnoses:  Contusion of left foot, initial encounter    ED Discharge Orders        Ordered    naproxen (NAPROSYN) 375 MG tablet  2 times daily     07/23/17 1930       Kerrie Buffaloeese, Josslyn Ciolek [facility]inaiM, [place]NP 07/23/17 2147    Benjiman CorePickering, Nathan, MD 07/23/17 [phone]

## 2017-08-30 ENCOUNTER — Ambulatory Visit (INDEPENDENT_AMBULATORY_CARE_PROVIDER_SITE_OTHER): Payer: BLUE CROSS/BLUE SHIELD | Admitting: Family Medicine

## 2017-08-30 ENCOUNTER — Encounter: Payer: Self-pay | Admitting: Family Medicine

## 2017-08-30 VITALS — BP 118/70 | HR 73 | Ht 69.0 in | Wt 138.0 lb

## 2017-08-30 DIAGNOSIS — R1031 Right lower quadrant pain: Secondary | ICD-10-CM

## 2017-08-30 DIAGNOSIS — Z0001 Encounter for general adult medical examination with abnormal findings: Secondary | ICD-10-CM | POA: Insufficient documentation

## 2017-08-30 LAB — COMPREHENSIVE METABOLIC PANEL
ALBUMIN: 4.4 g/dL (ref 3.5–5.2)
ALT: 14 U/L (ref 0–35)
AST: 16 U/L (ref 0–37)
Alkaline Phosphatase: 42 U/L (ref 39–117)
BUN: 12 mg/dL (ref 6–23)
CHLORIDE: 105 meq/L (ref 96–112)
CO2: 27 meq/L (ref 19–32)
CREATININE: 0.79 mg/dL (ref 0.40–1.20)
Calcium: 9.7 mg/dL (ref 8.4–10.5)
GFR: 106.81 mL/min (ref >60.00)
Glucose, Bld: 93 mg/dL (ref 70–99)
Potassium: 4 mEq/L (ref 3.5–5.1)
SODIUM: 138 meq/L (ref 135–145)
Total Bilirubin: 0.7 mg/dL (ref 0.2–1.2)
Total Protein: 7.4 g/dL (ref 6.0–8.3)

## 2017-08-30 LAB — LIPID PANEL
CHOL/HDL RATIO: 2
CHOLESTEROL: 165 mg/dL (ref 0–200)
HDL: 69.4 mg/dL (ref >39.00)
LDL CALC: 87 mg/dL (ref 0–99)
NonHDL: 95.42
Triglycerides: 43 mg/dL (ref 0.0–149.0)
VLDL: 8.6 mg/dL (ref 0.0–40.0)

## 2017-08-30 LAB — URINALYSIS, ROUTINE W REFLEX MICROSCOPIC
Bilirubin Urine: NEGATIVE
Hgb urine dipstick: NEGATIVE
KETONES UR: NEGATIVE
LEUKOCYTES UA: NEGATIVE
Nitrite: NEGATIVE
PH: 7.5 (ref 5.0–8.0)
RBC / HPF: NONE SEEN (ref 0–?)
SPECIFIC GRAVITY, URINE: 1.015 (ref 1.000–1.030)
TOTAL PROTEIN, URINE-UPE24: NEGATIVE
URINE GLUCOSE: NEGATIVE
UROBILINOGEN UA: 0.2 (ref 0.0–1.0)

## 2017-08-30 LAB — CBC
HCT: 37.9 % (ref 36.0–46.0)
Hemoglobin: 12.8 g/dL (ref 12.0–15.0)
MCHC: 33.9 g/dL (ref 30.0–36.0)
MCV: 98.3 fl (ref 78.0–100.0)
PLATELETS: 269 10*3/uL (ref 150.0–400.0)
RBC: 3.85 Mil/uL — AB (ref 3.87–5.11)
RDW: 13.2 % (ref 11.5–15.5)
WBC: 3.9 10*3/uL — ABNORMAL LOW (ref 4.0–10.5)

## 2017-08-30 NOTE — Progress Notes (Signed)
Subjective:  Patient ID: [name], female    DOB: 1980/08/27  Age: 37 y.o. MRN: [id number]  CC: Establish Care   HPI [name] presents for establishment of care and a physical exam.  She is also been having a 84-month history of right lower quadrant pain.  It is moving her bowels normally.  She is status post bilateral bilateral tubal ligation and uterine ablation.  She no longer has cycles.  Her ovaries remain intact.  Her father has a history of diverticulitis and she is concerned about that.  Last Pap smear was normal and performed in November of last year.  She has a history of spontaneous pneumothorax.  Last occurrence was 7 years ago when she quit smoking at that time.  She has no history of pulmonary blebs that she knows of.  She enjoys 1-2 glasses of wine nightly and nothing more.  She does not use illicit drugs.  She lives with her 46 year old children.  She works at [employer].  Her father is in his upper 63s and has diverticulitis.  Mother's health history is unknown.  Patient is active on her job and believes that she does obtain 10,000 steps when she works.    History [name] has a past medical history of Headache(784.0), History of tobacco abuse, cervical biopsy, chlamydia infection, gonorrhea, left breast biopsy, varicella, and Pneumothorax on left.   She has a past surgical history that includes insertion of left chest tube (01/20/11); biopsy of cervix; Colposcopy vulva w/ biopsy; Video assisted thoracoscopy (08/24/2011); Breast biopsy (2005); Tubal ligation (07/05/2012); and Novasure ablation (N/A, 04/06/2013).   Her family history includes Hypertension in her paternal grandmother.She reports that she quit smoking about 6 years ago. Her smoking use included cigarettes. She smoked 0.50 packs per day. she has never used smokeless tobacco. She reports that she drinks alcohol. She reports that she does not use drugs.  Outpatient Medications Prior to Visit  Medication Sig  Dispense Refill  . benzonatate (TESSALON) 100 MG capsule Take 1 capsule (100 mg total) by mouth 3 (three) times daily as needed for cough. (Patient not taking: Reported on 04/08/2015) 21 capsule 0  . cetirizine-pseudoephedrine (ZYRTEC-D) 5-120 MG tablet Take 1 tablet by mouth daily. 15 tablet 0  . esomeprazole (NEXIUM) 40 MG capsule Take 1 capsule (40 mg total) by mouth daily. (Patient not taking: Reported on 07/23/2017) 30 capsule 0  . fluticasone (FLONASE) 50 MCG/ACT nasal spray Place 2 sprays into both nostrils daily. (Patient not taking: Reported on 07/23/2017) 1 g 0  . ibuprofen (ADVIL,MOTRIN) 600 MG tablet Take 1 tablet (600 mg total) by mouth every 6 (six) hours as needed for pain. (Patient not taking: Reported on 04/08/2015) 20 tablet 0  . ipratropium (ATROVENT) 0.06 % nasal spray Place 2 sprays into both nostrils 4 (four) times daily. For nasal congestion (Patient not taking: Reported on 04/08/2015) 15 mL 0  . meclizine (ANTIVERT) 25 MG tablet Take 1 tablet (25 mg total) by mouth 3 (three) times daily as needed for dizziness. (Patient not taking: Reported on 04/08/2015) 12 tablet 0  . naproxen (NAPROSYN) 375 MG tablet Take 1 tablet (375 mg total) by mouth 2 (two) times daily. 20 tablet 0  . oxyCODONE-acetaminophen (PERCOCET) 10-325 MG per tablet Take 1 tablet by mouth every 4 (four) hours as needed for pain. (Patient not taking: Reported on 04/08/2015) 30 tablet 0   No facility-administered medications prior to visit.     ROS Review of Systems  Constitutional: Negative for diaphoresis, fatigue, fever and unexpected weight change.  HENT: Negative.   Eyes: Negative.   Respiratory: Negative.  Negative for chest tightness, shortness of breath and wheezing.   Cardiovascular: Negative.   Gastrointestinal: Positive for abdominal pain. Negative for anal bleeding, blood in stool, constipation, diarrhea, nausea, rectal pain and vomiting.  Endocrine: Negative for cold intolerance, heat intolerance,  polyphagia and polyuria.  Genitourinary: Negative for difficulty urinating, hematuria and vaginal discharge.  Musculoskeletal: Negative.   Skin: Negative.   Allergic/Immunologic: Negative for immunocompromised state.  Neurological: Negative for weakness and headaches.  Hematological: Does not bruise/bleed easily.  Psychiatric/Behavioral: Negative.     Objective:  BP 118/70 (BP Location: Left Arm, Patient Position: Sitting, Cuff Size: Normal)   Pulse 73   Ht 5\' 9"  (1.753 m)   Wt 138 lb (62.6 kg)   SpO2 99%   BMI 20.38 kg/m   Physical Exam  Constitutional: She is oriented to person, place, and time. She appears well-developed and well-nourished. No distress.  HENT:  Head: Normocephalic and atraumatic.  Right Ear: External ear normal.  Left Ear: External ear normal.  Mouth/Throat: Oropharynx is clear and moist. No oropharyngeal exudate.  Eyes: Conjunctivae are normal. Pupils are equal, round, and reactive to light. Right eye exhibits no discharge. Left eye exhibits no discharge. No scleral icterus.  Neck: Neck supple. No JVD present. No tracheal deviation present. No thyromegaly present.  Cardiovascular: Normal rate, regular rhythm and normal heart sounds.  Pulmonary/Chest: Effort normal and breath sounds normal. No stridor.  Abdominal: Soft. Bowel sounds are normal. She exhibits no distension and no mass. There is no hepatosplenomegaly, splenomegaly or hepatomegaly. There is tenderness in the right lower quadrant. There is no rebound, no guarding and no CVA tenderness.  Musculoskeletal: She exhibits no edema or tenderness.  Lymphadenopathy:    She has no cervical adenopathy.  Neurological: She is alert and oriented to person, place, and time.  Skin: Skin is warm and dry. She is not diaphoretic.  Psychiatric: She has a normal mood and affect. Her behavior is normal.      Assessment & Plan:   [name] was seen today for establish care.  Diagnoses and all orders for this  visit:  Encounter for health maintenance examination with abnormal findings -     CBC -     Comprehensive metabolic panel -     HIV antibody -     Lipid panel -     Urinalysis, Routine w reflex microscopic  Right lower quadrant abdominal pain -     CBC -     Comprehensive metabolic panel -     Urinalysis, Routine w reflex microscopic -     US Pelvis Complete; Future   I have discontinued Nayelis C. Langner's ibuprofen, oxyCODONE-acetaminophen, benzonatate, ipratropium, meclizine, esomeprazole, fluticasone, cetirizine-pseudoephedrine, and naproxen.  No orders of the defined types were placed in this encounter.  Blood work is pending.  Have ordered a pelvic ultrasound.  Follow-up recommended in 1 month.  Suggested that she download a pedometer on her phone to track her steps.  Believe that she is pretty much maintaining a healthy lifestyle and encouraged her to continue to do so.  Follow-up: Return in about 1 month (around 09/30/2017).  Mliss SaxWilliam Alfred Maysen Sudol, MD

## 2017-08-30 NOTE — Patient Instructions (Addendum)
Abdominal Pain, Adult Abdominal pain can be caused by many things. Often, abdominal pain is not serious and it gets better with no treatment or by being treated at home. However, sometimes abdominal pain is serious. Your health care provider will do a medical history and a physical exam to try to determine the cause of your abdominal pain. Follow these instructions at home:  Take over-the-counter and prescription medicines only as told by your health care provider. Do not take a laxative unless told by your health care provider.  Drink enough fluid to keep your urine clear or pale yellow.  Watch your condition for any changes.  Keep all follow-up visits as told by your health care provider. This is important. Contact a health care provider if:  Your abdominal pain changes or gets worse.  You are not hungry or you lose weight without trying.  You are constipated or have diarrhea for more than 2-3 days.  You have pain when you urinate or have a bowel movement.  Your abdominal pain wakes you up at night.  Your pain gets worse with meals, after eating, or with certain foods.  You are throwing up and cannot keep anything down.  You have a fever. Get help right away if:  Your pain does not go away as soon as your health care provider told you to expect.  You cannot stop throwing up.  Your pain is only in areas of the abdomen, such as the right side or the left lower portion of the abdomen.  You have bloody or black stools, or stools that look like tar.  You have severe pain, cramping, or bloating in your abdomen.  You have signs of dehydration, such as: ? Dark urine, very little urine, or no urine. ? Cracked lips. ? Dry mouth. ? Sunken eyes. ? Sleepiness. ? Weakness. This information is not intended to replace advice given to you by your health care provider. Make sure you discuss any questions you have with your health care provider. Document Released: 03/18/2005 Document  Revised: 12/27/2015 Document Reviewed: 11/20/2015 Elsevier Interactive Patient Education  2018 [place] Maintenance, Female Adopting a healthy lifestyle and getting preventive care can go a long way to promote health and wellness. Talk with your health care provider about what schedule of regular examinations is right for you. This is a good chance for you to check in with your provider about disease prevention and staying healthy. In between checkups, there are plenty of things you can do on your own. Experts have done a lot of research about which lifestyle changes and preventive measures are most likely to keep you healthy. Ask your health care provider for more information. Weight and diet Eat a healthy diet  Be sure to include plenty of vegetables, fruits, low-fat dairy products, and lean protein.  Do not eat a lot of foods high in solid fats, added sugars, or salt.  Get regular exercise. This is one of the most important things you can do for your health. ? Most adults should exercise for at least 150 minutes each week. The exercise should increase your heart rate and make you sweat (moderate-intensity exercise). ? Most adults should also do strengthening exercises at least twice a week. This is in addition to the moderate-intensity exercise.  Maintain a healthy weight  Body mass index (BMI) is a measurement that can be used to identify possible weight problems. It estimates body fat based on height and weight. Your health care provider can  help determine your BMI and help you achieve or maintain a healthy weight.  For females 61 years of age and older: ? A BMI below 18.5 is considered underweight. ? A BMI of 18.5 to 24.9 is normal. ? A BMI of 25 to 29.9 is considered overweight. ? A BMI of 30 and above is considered obese.  Watch levels of cholesterol and blood lipids  You should start having your blood tested for lipids and cholesterol at 35 years of age, then have  this test every 5 years.  You may need to have your cholesterol levels checked more often if: ? Your lipid or cholesterol levels are high. ? You are older than 35 years of age. ? You are at high risk for heart disease.  Cancer screening Lung Cancer  Lung cancer screening is recommended for adults 52-68 years old who are at high risk for lung cancer because of a history of smoking.  A yearly low-dose CT scan of the lungs is recommended for people who: ? Currently smoke. ? Have quit within the past 15 years. ? Have at least a 30-pack-year history of smoking. A pack year is smoking an average of one pack of cigarettes a day for 1 year.  Yearly screening should continue until it has been 15 years since you quit.  Yearly screening should stop if you develop a health problem that would prevent you from having lung cancer treatment.  Breast Cancer  Practice breast self-awareness. This means understanding how your breasts normally appear and feel.  It also means doing regular breast self-exams. Let your health care provider know about any changes, no matter how small.  If you are in your 20s or 30s, you should have a clinical breast exam (CBE) by a health care provider every 1-3 years as part of a regular health exam.  If you are 70 or older, have a CBE every year. Also consider having a breast X-ray (mammogram) every year.  If you have a family history of breast cancer, talk to your health care provider about genetic screening.  If you are at high risk for breast cancer, talk to your health care provider about having an MRI and a mammogram every year.  Breast cancer gene (BRCA) assessment is recommended for women who have family members with BRCA-related cancers. BRCA-related cancers include: ? Breast. ? Ovarian. ? Tubal. ? Peritoneal cancers.  Results of the assessment will determine the need for genetic counseling and BRCA1 and BRCA2 testing.  Cervical Cancer Your health care  provider may recommend that you be screened regularly for cancer of the pelvic organs (ovaries, uterus, and vagina). This screening involves a pelvic examination, including checking for microscopic changes to the surface of your cervix (Pap test). You may be encouraged to have this screening done every 3 years, beginning at age 36.  For women ages 72-65, health care providers may recommend pelvic exams and Pap testing every 3 years, or they may recommend the Pap and pelvic exam, combined with testing for human papilloma virus (HPV), every 5 years. Some types of HPV increase your risk of cervical cancer. Testing for HPV may also be done on women of any age with unclear Pap test results.  Other health care providers may not recommend any screening for nonpregnant women who are considered low risk for pelvic cancer and who do not have symptoms. Ask your health care provider if a screening pelvic exam is right for you.  If you have had past treatment  for cervical cancer or a condition that could lead to cancer, you need Pap tests and screening for cancer for at least 20 years after your treatment. If Pap tests have been discontinued, your risk factors (such as having a new sexual partner) need to be reassessed to determine if screening should resume. Some women have medical problems that increase the chance of getting cervical cancer. In these cases, your health care provider may recommend more frequent screening and Pap tests.  Colorectal Cancer  This type of cancer can be detected and often prevented.  Routine colorectal cancer screening usually begins at 35 years of age and continues through 35 years of age.  Your health care provider may recommend screening at an earlier age if you have risk factors for colon cancer.  Your health care provider may also recommend using home test kits to check for hidden blood in the stool.  A small camera at the end of a tube can be used to examine your colon  directly (sigmoidoscopy or colonoscopy). This is done to check for the earliest forms of colorectal cancer.  Routine screening usually begins at age 61.  Direct examination of the colon should be repeated every 5-10 years through 35 years of age. However, you may need to be screened more often if early forms of precancerous polyps or small growths are found.  Skin Cancer  Check your skin from head to toe regularly.  Tell your health care provider about any new moles or changes in moles, especially if there is a change in a mole's shape or color.  Also tell your health care provider if you have a mole that is larger than the size of a pencil eraser.  Always use sunscreen. Apply sunscreen liberally and repeatedly throughout the day.  Protect yourself by wearing long sleeves, pants, a wide-brimmed hat, and sunglasses whenever you are outside.  Heart disease, diabetes, and high blood pressure  High blood pressure causes heart disease and increases the risk of stroke. High blood pressure is more likely to develop in: ? People who have blood pressure in the high end of the normal range (130-139/85-89 mm Hg). ? People who are overweight or obese. ? People who are African American.  If you are 50-54 years of age, have your blood pressure checked every 3-5 years. If you are 66 years of age or older, have your blood pressure checked every year. You should have your blood pressure measured twice-once when you are at a hospital or clinic, and once when you are not at a hospital or clinic. Record the average of the two measurements. To check your blood pressure when you are not at a hospital or clinic, you can use: ? An automated blood pressure machine at a pharmacy. ? A home blood pressure monitor.  If you are between 67 years and 8 years old, ask your health care provider if you should take aspirin to prevent strokes.  Have regular diabetes screenings. This involves taking a blood sample to  check your fasting blood sugar level. ? If you are at a normal weight and have a low risk for diabetes, have this test once every three years after 35 years of age. ? If you are overweight and have a high risk for diabetes, consider being tested at a younger age or more often. Preventing infection Hepatitis B  If you have a higher risk for hepatitis B, you should be screened for this virus. You are considered at high risk for  hepatitis B if: ? You were born in a country where hepatitis B is common. Ask your health care provider which countries are considered high risk. ? Your parents were born in a high-risk country, and you have not been immunized against hepatitis B (hepatitis B vaccine). ? You have HIV or AIDS. ? You use needles to inject street drugs. ? You live with someone who has hepatitis B. ? You have had sex with someone who has hepatitis B. ? You get hemodialysis treatment. ? You take certain medicines for conditions, including cancer, organ transplantation, and autoimmune conditions.  Hepatitis C  Blood testing is recommended for: ? Everyone born from [birth year] through 1963. ? Anyone with known risk factors for hepatitis C.  Sexually transmitted infections (STIs)  You should be screened for sexually transmitted infections (STIs) including gonorrhea and chlamydia if: ? You are sexually active and are younger than 35 years of age. ? You are older than 35 years of age and your health care provider tells you that you are at risk for this type of infection. ? Your sexual activity has changed since you were last screened and you are at an increased risk for chlamydia or gonorrhea. Ask your health care provider if you are at risk.  If you do not have HIV, but are at risk, it may be recommended that you take a prescription medicine daily to prevent HIV infection. This is called pre-exposure prophylaxis (PrEP). You are considered at risk if: ? You are sexually active and do not regularly  use condoms or know the HIV status of your partner(s). ? You take drugs by injection. ? You are sexually active with a partner who has HIV.  Talk with your health care provider about whether you are at high risk of being infected with HIV. If you choose to begin PrEP, you should first be tested for HIV. You should then be tested every 3 months for as long as you are taking PrEP. Pregnancy  If you are premenopausal and you may become pregnant, ask your health care provider about preconception counseling.  If you may become pregnant, take 400 to 800 micrograms (mcg) of folic acid every day.  If you want to prevent pregnancy, talk to your health care provider about birth control (contraception). Osteoporosis and menopause  Osteoporosis is a disease in which the bones lose minerals and strength with aging. This can result in serious bone fractures. Your risk for osteoporosis can be identified using a bone density scan.  If you are 26 years of age or older, or if you are at risk for osteoporosis and fractures, ask your health care provider if you should be screened.  Ask your health care provider whether you should take a calcium or vitamin D supplement to lower your risk for osteoporosis.  Menopause may have certain physical symptoms and risks.  Hormone replacement therapy may reduce some of these symptoms and risks. Talk to your health care provider about whether hormone replacement therapy is right for you. Follow these instructions at home:  Schedule regular health, dental, and eye exams.  Stay current with your immunizations.  Do not use any tobacco products including cigarettes, chewing tobacco, or electronic cigarettes.  If you are pregnant, do not drink alcohol.  If you are breastfeeding, limit how much and how often you drink alcohol.  Limit alcohol intake to no more than 1 drink per day for nonpregnant women. One drink equals 12 ounces of beer, 5 ounces of wine, or  1 ounces  of hard liquor.  Do not use street drugs.  Do not share needles.  Ask your health care provider for help if you need support or information about quitting drugs.  Tell your health care provider if you often feel depressed.  Tell your health care provider if you have ever been abused or do not feel safe at home. This information is not intended to replace advice given to you by your health care provider. Make sure you discuss any questions you have with your health care provider. Document Released: 12/22/2010 Document Revised: 11/14/2015 Document Reviewed: 03/12/2015 Elsevier Interactive Patient Education  2018 [employer].  How to Increase Your Level of Physical Activity Getting regular physical activity is important for your overall health and well-being. Most people do not get enough exercise. There are easy ways to increase your level of physical activity, even if you have not been very active in the past or you are just starting out. Why is physical activity important? Physical activity has many short-term and long-term health benefits. Regular exercise can:  Help you lose weight or maintain a healthy weight.  Strengthen your muscles and bones.  Boost your mood and improve self-esteem.  Reduce your risk of certain long-term (chronic) diseases, like heart disease, cancer, and diabetes.  Help you stay capable of walking and moving around (mobile) as you age.  Prevent accidents, such as falls, as you age.  Increase life expectancy.  What are the benefits of being physically active on a regular basis? In addition to improving your physical health, being physically active on most days of the week can help you in ways that you may not expect. Benefits of regular physical activity may include:  Feeling good about your body.  Being able to move around more easily and for longer periods of time without getting tired (increased stamina).  Finding new sources of fun and  enjoyment.  Meeting new people who share a common interest.  Being able to fight off illness better (enhanced immunity).  Being able to sleep better.  What can happen if I am not physically active on a regular basis? Not getting enough physical activity can lead to an unhealthy lifestyle and future health problems. This can increase your chances of:  Becoming overweight or obese.  Becoming sick.  Developing chronic illnesses, like heart disease or diabetes.  Having mental health problems, like depression or anxiety.  Having sleep problems.  Having trouble walking or getting yourself around (reduced mobility).  Injuring yourself in a fall as you get older.  What steps can I take to be more physically active?  Check with your health care provider about how to get started. Ask your health care provider what activities are safe for you.  Start out slowly. Walking or doing some simple chair exercises is a good place to start, especially if you have not been active before or for a long time.  Try to find activities that you enjoy. You are more likely to commit to an exercise routine if it does not feel like a chore.  If you have bone or joint problems, choose low-impact exercises, like walking or swimming.  Include physical activity in your everyday routine.  Invite friends or family members to exercise with you. This also will help you commit to your workout plan.  Set goals that you can work toward.  Aim for at least 150 minutes of moderate-intensity exercise each week. Examples of moderate-intensity exercise include walking or riding a  bike. Where to find more information:  Centers for Disease Control and Prevention: [employer]  [employer] on [employer], Sports & Nutrition www.http://villegas.org/  ChooseMyPlate: [employer] Contact a health care provider if:  You have headaches, muscle aches, or  joint pain.  You feel dizzy or light-headed while exercising.  You faint.  You have chest pain while exercising. Summary  Exercise benefits your mind and body at any age, even if you are just starting out.  If you have a chronic illness or have not been active for a while, check with your health care provider before increasing your physical activity.  Choose activities that are safe and enjoyable for you.Ask your health care provider what activities are safe for you.  Start slowly. Tell your health care provider if you have problems as you start to increase your activity level. This information is not intended to replace advice given to you by your health care provider. Make sure you discuss any questions you have with your health care provider. Document Released: 05/28/2016 Document Revised: 05/28/2016 Document Reviewed: 05/28/2016 Elsevier Interactive Patient Education  [employer].

## 2017-08-31 LAB — HIV ANTIBODY (ROUTINE TESTING W REFLEX): HIV: NONREACTIVE

## 2017-09-01 ENCOUNTER — Encounter: Payer: Self-pay | Admitting: Family Medicine

## 2017-09-03 ENCOUNTER — Other Ambulatory Visit: Payer: Self-pay

## 2017-09-03 DIAGNOSIS — R1031 Right lower quadrant pain: Secondary | ICD-10-CM

## 2017-09-06 DIAGNOSIS — F4325 Adjustment disorder with mixed disturbance of emotions and conduct: Secondary | ICD-10-CM | POA: Diagnosis not present

## 2017-09-13 ENCOUNTER — Ambulatory Visit
Admission: RE | Admit: 2017-09-13 | Discharge: 2017-09-13 | Disposition: A | Discharge status: Home / Self Care | Payer: BLUE CROSS/BLUE SHIELD | Source: Ambulatory Visit | Attending: Family Medicine | Admitting: Family Medicine

## 2017-09-13 DIAGNOSIS — R1031 Right lower quadrant pain: Secondary | ICD-10-CM | POA: Diagnosis not present

## 2017-09-13 DIAGNOSIS — F4325 Adjustment disorder with mixed disturbance of emotions and conduct: Secondary | ICD-10-CM | POA: Diagnosis not present

## 2017-09-16 ENCOUNTER — Encounter: Payer: Self-pay | Admitting: Family Medicine

## 2017-09-16 ENCOUNTER — Ambulatory Visit (INDEPENDENT_AMBULATORY_CARE_PROVIDER_SITE_OTHER): Payer: BLUE CROSS/BLUE SHIELD | Admitting: Family Medicine

## 2017-09-16 VITALS — BP 106/64 | HR 88 | Ht 69.0 in | Wt 142.1 lb

## 2017-09-16 DIAGNOSIS — R1031 Right lower quadrant pain: Secondary | ICD-10-CM | POA: Diagnosis not present

## 2017-09-16 DIAGNOSIS — R109 Unspecified abdominal pain: Secondary | ICD-10-CM | POA: Insufficient documentation

## 2017-09-16 NOTE — Progress Notes (Signed)
Subjective:  Patient ID: [name], female    DOB: 01/01/1981  Age: 37 y.o. MRN: [id number]  CC: Follow-up (abdominal pain)   HPI [name] presents for follow-up of her pelvic ultrasound and laboratory studies regarding her complaint of a 3957-month history of right lower abdominal pain.  Pelvic ultrasound was essentially normal but did show a complex hypoechoic mass in the uterus felt to be consistent with her history of ablation.  She has had no abdominal surgeries other than a laparoscopic BTL.  Bowel habits remain normal.  She is having no dysuria or vaginal discharge.  Last sexual activity was 8 months ago.  She does tell of sharp stabbing mid abdominal pain that stops her from whatever it is she is doing.  Urinalysis was normal.  No history of renal lithiasis.  No outpatient medications prior to visit.   No facility-administered medications prior to visit.     ROS Review of Systems  Constitutional: Negative for chills, fever and unexpected weight change.  Respiratory: Negative.   Cardiovascular: Negative.   Gastrointestinal: Negative for abdominal distention, blood in stool, constipation, diarrhea, nausea, rectal pain and vomiting.  Genitourinary: Negative for difficulty urinating, dysuria and vaginal discharge.  Allergic/Immunologic: Negative for immunocompromised state.  Hematological: Does not bruise/bleed easily.  Psychiatric/Behavioral: Negative.     Objective:  BP 106/64 (BP Location: Left Arm, Patient Position: Sitting, Cuff Size: Normal)   Pulse 88   Ht 5\' 9"  (1.753 m)   Wt 142 lb 2 oz (64.5 kg)   SpO2 99%   BMI 20.99 kg/m   BP Readings from Last 3 Encounters:  09/16/17 106/64  08/30/17 118/70  07/23/17 110/65    Wt Readings from Last 3 Encounters:  09/16/17 142 lb 2 oz (64.5 kg)  08/30/17 138 lb (62.6 kg)  07/23/17 136 lb (61.7 kg)    Physical Exam  Constitutional: She is oriented to person, place, and time. She appears well-developed and  well-nourished. No distress.  HENT:  Head: Normocephalic and atraumatic.  Right Ear: External ear normal.  Left Ear: External ear normal.  Eyes: Right eye exhibits no discharge. Left eye exhibits no discharge. No scleral icterus.  Neck: No JVD present. No tracheal deviation present.  Pulmonary/Chest: Effort normal. No stridor.  Neurological: She is oriented to person, place, and time.  Skin: Skin is warm and dry. She is not diaphoretic.  Psychiatric: She has a normal mood and affect. Her behavior is normal.    Lab Results  Component Value Date   WBC 3.9 (L) 08/30/2017   HGB 12.8 08/30/2017   HCT 37.9 08/30/2017   PLT 269.0 08/30/2017   GLUCOSE 93 08/30/2017   CHOL 165 08/30/2017   TRIG 43.0 08/30/2017   HDL 69.40 08/30/2017   LDLCALC 87 08/30/2017   ALT 14 08/30/2017   AST 16 08/30/2017   NA 138 08/30/2017   K 4.0 08/30/2017   CL 105 08/30/2017   CREATININE 0.79 08/30/2017   BUN 12 08/30/2017   CO2 27 08/30/2017   INR 1.01 08/20/2011    [place] Pelvic Complete With Transvaginal  Result Date: 09/13/2017 CLINICAL DATA:  RIGHT lower quadrant pain for 2 months, prior endometrial ablation in 2015 EXAM: TRANSABDOMINAL AND TRANSVAGINAL ULTRASOUND OF PELVIS TECHNIQUE: Both transabdominal and transvaginal ultrasound examinations of the pelvis were performed. Transabdominal technique was performed for global imaging of the pelvis including uterus, ovaries, adnexal regions, and pelvic cul-de-sac. It was necessary to proceed with endovaginal exam following the transabdominal exam to  visualize the endometrium. COMPARISON:  None FINDINGS: Uterus Measurements: 8.7 x 5.3 x 7.0 cm. Mildly heterogeneous myometrium. No definite fibroids. Cystic collection centrally within upper to mid uterus, see below Endometrium Thickness: 6 mm thick at mid to lower uterine segments, heterogeneous and prominent. Endometrial complex at the upper to mid uterine segments is not delineated. At this position, a large  bilobed complex hypoechoic collection is identified measuring 2.1 x 3.7 x 4.2 cm in size, most likely representing sequela of prior endometrial ablation. Right ovary Measurements: 3.2 x 1.8 x 2.4 cm. Normal morphology without mass. Internal blood flow present on color Doppler imaging. Left ovary Measurements: 3.1 x 2.0 x 2.7 cm. Small resolving corpus luteum. No mass. Internal blood flow present on color Doppler imaging. Other findings No free pelvic fluid or adnexal masses. Incidentally noted minimally prominent pelvic vessels. IMPRESSION: Complex hypoechoic bilobed collection at the upper uterine mid uterine segments measuring 2.1 x 3.7 x 4.2 cm in size likely representing sequela of prior endometrial ablation. Otherwise negative exam. Electronically Signed   By: Ulyses Southward M.D.   On: 09/13/2017 18:51    Assessment & Plan:   [name] was seen today for follow-up.  Diagnoses and all orders for this visit:  Right lower quadrant abdominal pain -     Ambulatory referral to Gynecology -     CT Abdomen Pelvis W Contrast; Future   [name] does not currently have medications on file.  No orders of the defined types were placed in this encounter.    Follow-up: Return pends results of ct.  Mliss Sax, MD

## 2017-09-27 DIAGNOSIS — F4325 Adjustment disorder with mixed disturbance of emotions and conduct: Secondary | ICD-10-CM | POA: Diagnosis not present

## 2017-09-27 DIAGNOSIS — R109 Unspecified abdominal pain: Secondary | ICD-10-CM | POA: Diagnosis not present

## 2017-09-27 DIAGNOSIS — Z6838 Body mass index (BMI) 38.0-38.9, adult: Secondary | ICD-10-CM | POA: Diagnosis not present

## 2017-09-27 DIAGNOSIS — A749 Chlamydial infection, unspecified: Secondary | ICD-10-CM | POA: Diagnosis not present

## 2017-09-30 ENCOUNTER — Ambulatory Visit: Payer: BLUE CROSS/BLUE SHIELD | Admitting: Family Medicine

## 2017-09-30 ENCOUNTER — Ambulatory Visit (INDEPENDENT_AMBULATORY_CARE_PROVIDER_SITE_OTHER)
Admission: RE | Admit: 2017-09-30 | Discharge: 2017-09-30 | Disposition: A | Discharge status: Home / Self Care | Payer: BLUE CROSS/BLUE SHIELD | Source: Ambulatory Visit | Attending: Family Medicine | Admitting: Family Medicine

## 2017-09-30 DIAGNOSIS — R1031 Right lower quadrant pain: Secondary | ICD-10-CM

## 2017-09-30 MED ORDER — IOPAMIDOL (ISOVUE-300) INJECTION 61%
100.0000 mL | Freq: Once | INTRAVENOUS | Status: AC | PRN
  Administered 2017-09-30: 100 mL via INTRAVENOUS

## 2017-10-04 ENCOUNTER — Encounter: Payer: Self-pay | Admitting: Family Medicine

## 2017-10-04 ENCOUNTER — Ambulatory Visit (INDEPENDENT_AMBULATORY_CARE_PROVIDER_SITE_OTHER): Payer: BLUE CROSS/BLUE SHIELD | Admitting: Family Medicine

## 2017-10-04 VITALS — BP 110/70 | HR 71 | Ht 69.0 in | Wt 139.4 lb

## 2017-10-04 DIAGNOSIS — F5101 Primary insomnia: Secondary | ICD-10-CM

## 2017-10-04 DIAGNOSIS — R7989 Other specified abnormal findings of blood chemistry: Secondary | ICD-10-CM

## 2017-10-04 DIAGNOSIS — R1031 Right lower quadrant pain: Secondary | ICD-10-CM | POA: Diagnosis not present

## 2017-10-04 LAB — TSH: TSH: 1.58 u[IU]/mL (ref 0.35–4.50)

## 2017-10-04 NOTE — Patient Instructions (Signed)
Insomnia Insomnia is a sleep disorder that makes it difficult to fall asleep or to stay asleep. Insomnia can cause tiredness (fatigue), low energy, difficulty concentrating, mood swings, and poor performance at work or school. There are three different ways to classify insomnia:  Difficulty falling asleep.  Difficulty staying asleep.  Waking up too early in the morning.  Any type of insomnia can be long-term (chronic) or short-term (acute). Both are common. Short-term insomnia usually lasts for three months or less. Chronic insomnia occurs at least three times a week for longer than three months. What are the causes? Insomnia may be caused by another condition, situation, or substance, such as:  Anxiety.  Certain medicines.  Gastroesophageal reflux disease (GERD) or other gastrointestinal conditions.  Asthma or other breathing conditions.  Restless legs syndrome, sleep apnea, or other sleep disorders.  Chronic pain.  Menopause. This may include hot flashes.  Stroke.  Abuse of alcohol, tobacco, or illegal drugs.  Depression.  Caffeine.  Neurological disorders, such as Alzheimer disease.  An overactive thyroid (hyperthyroidism).  The cause of insomnia may not be known. What increases the risk? Risk factors for insomnia include:  Gender. Women are more commonly affected than men.  Age. Insomnia is more common as you get older.  Stress. This may involve your professional or personal life.  Income. Insomnia is more common in people with lower income.  Lack of exercise.  Irregular work schedule or night shifts.  Traveling between different time zones.  What are the signs or symptoms? If you have insomnia, trouble falling asleep or trouble staying asleep is the main symptom. This may lead to other symptoms, such as:  Feeling fatigued.  Feeling nervous about going to sleep.  Not feeling rested in the morning.  Having trouble concentrating.  Feeling  irritable, anxious, or depressed.  How is this treated? Treatment for insomnia depends on the cause. If your insomnia is caused by an underlying condition, treatment will focus on addressing the condition. Treatment may also include:  Medicines to help you sleep.  Counseling or therapy.  Lifestyle adjustments.  Follow these instructions at home:  Take medicines only as directed by your health care provider.  Keep regular sleeping and waking hours. Avoid naps.  Keep a sleep diary to help you and your health care provider figure out what could be causing your insomnia. Include: ? When you sleep. ? When you wake up during the night. ? How well you sleep. ? How rested you feel the next day. ? Any side effects of medicines you are taking. ? What you eat and drink.  Make your bedroom a comfortable place where it is easy to fall asleep: ? Put up shades or special blackout curtains to block light from outside. ? Use a white noise machine to block noise. ? Keep the temperature cool.  Exercise regularly as directed by your health care provider. Avoid exercising right before bedtime.  Use relaxation techniques to manage stress. Ask your health care provider to suggest some techniques that may work well for you. These may include: ? Breathing exercises. ? Routines to release muscle tension. ? Visualizing peaceful scenes.  Cut back on alcohol, caffeinated beverages, and cigarettes, especially close to bedtime. These can disrupt your sleep.  Do not overeat or eat spicy foods right before bedtime. This can lead to digestive discomfort that can make it hard for you to sleep.  Limit screen use before bedtime. This includes: ? Watching TV. ? Using your smartphone, tablet, and   computer.  Stick to a routine. This can help you fall asleep faster. Try to do a quiet activity, brush your teeth, and go to bed at the same time each night.  Get out of bed if you are still awake after 15 minutes  of trying to sleep. Keep the lights down, but try reading or doing a quiet activity. When you feel sleepy, go back to bed.  Make sure that you drive carefully. Avoid driving if you feel very sleepy.  Keep all follow-up appointments as directed by your health care provider. This is important. Contact a health care provider if:  You are tired throughout the day or have trouble in your daily routine due to sleepiness.  You continue to have sleep problems or your sleep problems get worse. Get help right away if:  You have serious thoughts about hurting yourself or someone else. This information is not intended to replace advice given to you by your health care provider. Make sure you discuss any questions you have with your health care provider. Document Released: 06/05/2000 Document Revised: 11/08/2015 Document Reviewed: 03/09/2014 Elsevier Interactive Patient Education  2018 [employer]  Constipation, Adult Constipation is when a person:  Poops (has a bowel movement) fewer times in a week than normal.  Has a hard time pooping.  Has poop that is dry, hard, or bigger than normal.  Follow these instructions at home: Eating and drinking   Eat foods that have a lot of fiber, such as: ? Fresh fruits and vegetables. ? Whole grains. ? Beans.  Eat less of foods that are high in fat, low in fiber, or overly processed, such as: ? [place] fries. ? Hamburgers. ? Cookies. ? Candy. ? Soda.  Drink enough fluid to keep your pee (urine) clear or pale yellow. General instructions  Exercise regularly or as told by your doctor.  Go to the restroom when you feel like you need to poop. Do not hold it in.  Take over-the-counter and prescription medicines only as told by your doctor. These include any fiber supplements.  Do pelvic floor retraining exercises, such as: ? Doing deep breathing while relaxing your lower belly (abdomen). ? Relaxing your pelvic floor while pooping.  Watch your  condition for any changes.  Keep all follow-up visits as told by your doctor. This is important. Contact a doctor if:  You have pain that gets worse.  You have a fever.  You have not pooped for 4 days.  You throw up (vomit).  You are not hungry.  You lose weight.  You are bleeding from the anus.  You have thin, pencil-like poop (stool). Get help right away if:  You have a fever, and your symptoms suddenly get worse.  You leak poop or have blood in your poop.  Your belly feels hard or bigger than normal (is bloated).  You have very bad belly pain.  You feel dizzy or you faint. This information is not intended to replace advice given to you by your health care provider. Make sure you discuss any questions you have with your health care provider. Document Released: 11/25/2007 Document Revised: 12/27/2015 Document Reviewed: 11/27/2015 Elsevier Interactive Patient Education  2018 [employer]

## 2017-10-04 NOTE — Progress Notes (Signed)
Subjective:  Patient ID: [name], female    DOB: 12/24/1980  Age: 37 y.o. MRN: [id number]  CC: Follow-up   HPI [name] presents for follow-up of her right lower quadrant pain.  She saw her OB doctor who prescribed a trial of hycosamine.  Once again with regards to this still will stool burden reported in her CT scan she tells me that she stools almost every day if not every other day.  Her stool consistency is soft.  There is no blood or pus.  We discussed the CT report that it was quite reassuring with regards to the remainder of the organs in her abdominal cavity.  She reports issues with insomnia and has not tried any medicines as of yet.  She admits that she is a single.  Often working 2 jobs and going to school.  She is busy.  No outpatient medications prior to visit.   No facility-administered medications prior to visit.     ROS Review of Systems  Constitutional: Negative for chills, fever and unexpected weight change.  HENT: Negative.   Eyes: Negative.   Respiratory: Negative.   Cardiovascular: Negative.   Gastrointestinal: Negative for anal bleeding, blood in stool, constipation, diarrhea, nausea, rectal pain and vomiting.  Endocrine: Negative for cold intolerance and heat intolerance.  Genitourinary: Negative.   Musculoskeletal: Negative.   Skin: Negative for rash.  Allergic/Immunologic: Negative for immunocompromised state.  Neurological: Negative.   Hematological: Does not bruise/bleed easily.  Psychiatric/Behavioral: Positive for sleep disturbance. Negative for dysphoric mood.    Objective:  BP 110/70 (BP Location: Left Arm, Patient Position: Sitting, Cuff Size: Normal)   Pulse 71   Ht 5\' 9"  (1.753 m)   Wt 139 lb 6 oz (63.2 kg)   SpO2 99%   BMI 20.58 kg/m   BP Readings from Last 3 Encounters:  10/04/17 110/70  09/16/17 106/64  08/30/17 118/70    Wt Readings from Last 3 Encounters:  10/04/17 139 lb 6 oz (63.2 kg)  09/16/17 142 lb 2 oz (64.5  kg)  08/30/17 138 lb (62.6 kg)    Physical Exam  Constitutional: She is oriented to person, place, and time. She appears well-developed and well-nourished. No distress.  HENT:  Head: Normocephalic and atraumatic.  Right Ear: External ear normal.  Left Ear: External ear normal.  Eyes: Conjunctivae are normal. Right eye exhibits no discharge. Left eye exhibits no discharge. No scleral icterus.  Neck: No JVD present. No tracheal deviation present.  Pulmonary/Chest: Effort normal.  Neurological: She is alert and oriented to person, place, and time.  Skin: She is not diaphoretic.  Psychiatric: She has a normal mood and affect. Her behavior is normal.    Lab Results  Component Value Date   WBC 3.9 (L) 08/30/2017   HGB 12.8 08/30/2017   HCT 37.9 08/30/2017   PLT 269.0 08/30/2017   GLUCOSE 93 08/30/2017   CHOL 165 08/30/2017   TRIG 43.0 08/30/2017   HDL 69.40 08/30/2017   LDLCALC 87 08/30/2017   ALT 14 08/30/2017   AST 16 08/30/2017   NA 138 08/30/2017   K 4.0 08/30/2017   CL 105 08/30/2017   CREATININE 0.79 08/30/2017   BUN 12 08/30/2017   CO2 27 08/30/2017   INR 1.01 08/20/2011    Ct Abdomen Pelvis W Contrast  Result Date: 10/01/2017 CLINICAL DATA:  Right lower quadrant intermittent pain. EXAM: CT ABDOMEN AND PELVIS WITH CONTRAST TECHNIQUE: Multidetector CT imaging of the abdomen and pelvis was  performed using the standard protocol following bolus administration of intravenous contrast. CONTRAST:  ISOVUE-300 IOPAMIDOL (ISOVUE-300) INJECTION 61% COMPARISON:  None. FINDINGS: Lower chest: Lung bases are clear. No effusions. Heart is normal size. Hepatobiliary: No focal hepatic abnormality. Gallbladder unremarkable. Pancreas: No focal abnormality or ductal dilatation. Spleen: No focal abnormality.  Normal size. Adrenals/Urinary Tract: No adrenal abnormality. No focal renal abnormality. No stones or hydronephrosis. Urinary bladder is unremarkable. Stomach/Bowel: Appendix is  normal. Moderate stool burden throughout the colon. Stomach and small bowel decompressed, unremarkable. Vascular/Lymphatic: No evidence of aneurysm or adenopathy. Reproductive: Bilobed fluid-filled area noted centrally within the uterus. This is of unclear etiology. This presumably could reflect cystic degeneration of a central fibroid or potentially fluid within the endometrium. This could be further evaluated with pelvic ultrasound. Small follicles in the ovaries bilaterally. Other: No free fluid or free air. Musculoskeletal: No acute bony abnormality. IMPRESSION: Normal appendix.  Moderate stool burden throughout the colon. Bilobed cystic appearing area centrally within the uterus of unknown etiology. Differential considerations would include cystic degeneration of a central fibroid or fluid within a distended endometrial cavity. Consider further evaluation with pelvic ultrasound. Electronically Signed   By: Charlett Nose M.D.   On: 10/01/2017 08:18    Assessment & Plan:   [name] was seen today for follow-up.  Diagnoses and all orders for this visit:  Right lower quadrant abdominal pain  Primary insomnia  Abnormal CBC -     TSH   [name] does not currently have medications on file.  No orders of the defined types were placed in this encounter.  She has that we fax copies of her CT and ultrasound to her GYN provider Dr. Malva Limes.  She was given information on sleep hygiene and constipation.  I asked her to follow-up with Dr. Dareen Piano with regards to the post ablation changes as noted in her uterus and she will do that.  She will follow back up with me in 3 months or sooner if needed.  She will try melatonin 3 mg nightly as needed for sleep.  Follow-up: Return in about 3 months (around 01/03/2018), or if symptoms worsen or fail to improve.  Mliss Sax, MD

## 2017-10-18 DIAGNOSIS — R102 Pelvic and perineal pain: Secondary | ICD-10-CM | POA: Diagnosis not present

## 2017-10-18 DIAGNOSIS — Z6820 Body mass index (BMI) 20.0-20.9, adult: Secondary | ICD-10-CM | POA: Diagnosis not present

## 2017-11-16 DIAGNOSIS — R933 Abnormal findings on diagnostic imaging of other parts of digestive tract: Secondary | ICD-10-CM | POA: Diagnosis not present

## 2017-11-16 DIAGNOSIS — R1031 Right lower quadrant pain: Secondary | ICD-10-CM | POA: Diagnosis not present

## 2017-11-30 ENCOUNTER — Other Ambulatory Visit: Payer: Self-pay | Admitting: Obstetrics and Gynecology

## 2017-12-03 ENCOUNTER — Other Ambulatory Visit: Payer: Self-pay | Admitting: Obstetrics and Gynecology

## 2017-12-17 ENCOUNTER — Encounter (HOSPITAL_COMMUNITY): Payer: Self-pay | Admitting: *Deleted

## 2017-12-17 ENCOUNTER — Emergency Department (HOSPITAL_COMMUNITY)
Admission: EM | Admit: 2017-12-17 | Discharge: 2017-12-17 | Disposition: A | Discharge status: Home / Self Care | Payer: BLUE CROSS/BLUE SHIELD | Attending: Emergency Medicine | Admitting: Emergency Medicine

## 2017-12-17 ENCOUNTER — Emergency Department (HOSPITAL_COMMUNITY): Payer: BLUE CROSS/BLUE SHIELD

## 2017-12-17 DIAGNOSIS — Z87891 Personal history of nicotine dependence: Secondary | ICD-10-CM | POA: Diagnosis not present

## 2017-12-17 DIAGNOSIS — R109 Unspecified abdominal pain: Secondary | ICD-10-CM | POA: Diagnosis not present

## 2017-12-17 DIAGNOSIS — R102 Pelvic and perineal pain: Secondary | ICD-10-CM | POA: Diagnosis not present

## 2017-12-17 DIAGNOSIS — N856 Intrauterine synechiae: Secondary | ICD-10-CM | POA: Insufficient documentation

## 2017-12-17 LAB — I-STAT BETA HCG BLOOD, ED (MC, WL, AP ONLY): I-stat hCG, quantitative: <5 m[IU]/mL (ref <5)

## 2017-12-17 LAB — CBC
HEMATOCRIT: 38.2 % (ref 36.0–46.0)
HEMOGLOBIN: 13.1 g/dL (ref 12.0–15.0)
MCH: 33.1 pg (ref 26.0–34.0)
MCHC: 34.3 g/dL (ref 30.0–36.0)
MCV: 96.5 fL (ref 78.0–100.0)
Platelets: 296 10*3/uL (ref 150–400)
RBC: 3.96 MIL/uL (ref 3.87–5.11)
RDW: 12.9 % (ref 11.5–15.5)
WBC: 7.5 10*3/uL (ref 4.0–10.5)

## 2017-12-17 LAB — COMPREHENSIVE METABOLIC PANEL
ALBUMIN: 3.9 g/dL (ref 3.5–5.0)
ALT: 19 U/L (ref 0–44)
AST: 23 U/L (ref 15–41)
Alkaline Phosphatase: 46 U/L (ref 38–126)
Anion gap: 8 (ref 5–15)
BUN: 11 mg/dL (ref 6–20)
CHLORIDE: 107 mmol/L (ref 98–111)
CO2: 24 mmol/L (ref 22–32)
Calcium: 9.6 mg/dL (ref 8.9–10.3)
Creatinine, Ser: 0.87 mg/dL (ref 0.44–1.00)
GFR calc Af Amer: >60 mL/min (ref >60)
GFR calc non Af Amer: >60 mL/min (ref >60)
GLUCOSE: 97 mg/dL (ref 70–99)
POTASSIUM: 3.4 mmol/L — AB (ref 3.5–5.1)
SODIUM: 139 mmol/L (ref 135–145)
TOTAL PROTEIN: 7.4 g/dL (ref 6.5–8.1)
Total Bilirubin: 0.7 mg/dL (ref 0.3–1.2)

## 2017-12-17 LAB — LIPASE, BLOOD: LIPASE: 30 U/L (ref 11–51)

## 2017-12-17 MED ORDER — NAPROXEN 500 MG PO TABS: 500.0000 mg | ORAL_TABLET | Freq: Two times a day (BID) | ORAL | 0 refills | Status: DC

## 2017-12-17 MED ORDER — KETOROLAC TROMETHAMINE 60 MG/2ML IM SOLN
30.0000 mg | Freq: Once | INTRAMUSCULAR | Status: AC
  Administered 2017-12-17: 30 mg via INTRAMUSCULAR
  Filled 2017-12-17: qty 2

## 2017-12-17 NOTE — ED Provider Notes (Signed)
[facility]-EMERGENCY DEPT Provider Note   CSN: [id number] Arrival date & time: 12/17/17  1343     History   Chief Complaint Chief Complaint  Patient presents with  . Abdominal Pain    HPI Jessica ERKKILA is a 37 y.o. female who presents to the ED with abdominal pain. Patient is scheduled for hysterectomy. Patient has had pain for several months but it has gotten worse over the past 4 days. Patient took Vicodin at 11 am without relief. Patient had CT of abdomen/pelvis on 10/01/17 which showed a cystic mass in the uterus.   HPI  Past Medical History:  Diagnosis Date  . Allergy   . Chicken pox   . Depression   . Frequent headaches   . GERD (gastroesophageal reflux disease)   . Headache(784.0)    otc med prn  . History of tobacco abuse   . Hx of cervical biopsy   . Hx of chlamydia infection   . Hx of gonorrhea   . Hx of left breast biopsy   . Hx of varicella   . Migraines   . Pneumothorax on left     Patient Active Problem List   Diagnosis Date Noted  . Abdominal pain 09/16/2017  . Encounter for health maintenance examination with abnormal findings 08/30/2017  . Right lower quadrant abdominal pain 08/30/2017  . LSIL (low grade squamous intraepithelial lesion) on Pap smear 07/27/2011  . Pneumothorax on left   . Hx of left breast biopsy   . Hx of cervical biopsy   . History of tobacco abuse     Past Surgical History:  Procedure Laterality Date  . biopsy of cervix    . BREAST BIOPSY  2005   cyst removed L breast  . COLPOSCOPY VULVA W/ BIOPSY     Normal results  . insertion of left chest tube  01/20/11   Dr Donata Clay  . NOVASURE ABLATION N/A 04/06/2013   Procedure: NOVASURE ABLATION;  Surgeon: Levi Aland, MD;  Location: [facility];  Service: Gynecology;  Laterality: N/A;  . PLEURAL SCARIFICATION  2012 & 2013  . TUBAL LIGATION  07/05/2012   Procedure: POST PARTUM TUBAL LIGATION;  Surgeon: Mickel Baas, MD;  Location: [facility];  Service:  Gynecology;  Laterality: Bilateral;  . VIDEO ASSISTED THORACOSCOPY  08/24/2011   Procedure: VIDEO ASSISTED THORACOSCOPY;  Surgeon: Norton Blizzard, MD;  Location: [facility] OR;  Service: Thoracic;  Laterality: Left;  Left Video assisted thorascopic surgery , RESECTION OF APICIAL BLEB WITH PLEURODESIS     OB History    Gravida  2   Para  2   Term  2   Preterm      AB      Living  2     SAB      TAB      Ectopic      Multiple      Live Births  2            Home Medications    Prior to Admission medications   Medication Sig Start Date End Date Taking? Authorizing Provider  naproxen (NAPROSYN) 500 MG tablet Take 1 tablet (500 mg total) by mouth 2 (two) times daily. 12/17/17   Janne Napoleon, NP  iron polysaccharides (NIFEREX) 150 MG capsule Take 1 capsule (150 mg total) by mouth daily. For one month then stop. 08/26/11 09/16/11  Ardelle Balls, PA-C    Family History Family History  Problem Relation  Age of Onset  . Alcohol abuse Mother   . Depression Mother   . Drug abuse Mother   . Depression Brother   . Depression Brother   . Cancer Paternal Grandmother   . Mental illness Paternal Grandmother   . Other Neg Hx     Social History Social History   Tobacco Use  . Smoking status: Former Smoker    Packs/day: 0.50    Types: Cigarettes    Last attempt to quit: 11/21/2010    Years since quitting: 7.0  . Smokeless tobacco: Never Used  Substance Use Topics  . Alcohol use: Yes    Comment: socially  . Drug use: No     Allergies   Penicillins   Review of Systems Review of Systems  Constitutional: Positive for chills. Negative for fever.  HENT: Negative.   Eyes: Negative for pain, discharge, redness and visual disturbance.  Respiratory: Negative for cough and shortness of breath.   Cardiovascular: Negative for chest pain.  Gastrointestinal: Positive for abdominal pain and nausea. Negative for vomiting.  Genitourinary: Negative for dysuria, frequency,  urgency, vaginal bleeding and vaginal discharge.  Musculoskeletal: Negative for back pain.  Skin: Negative for rash.  Neurological: Negative for syncope and headaches.  Psychiatric/Behavioral: Negative for confusion.     Physical Exam Updated Vital Signs BP 99/63   Pulse 76   Temp (!) 97.3 F (36.3 C) (Oral)   Resp 18   SpO2 99%   Physical Exam  Constitutional: She appears well-developed and well-nourished. No distress.  HENT:  Head: Normocephalic.  Eyes: EOM are normal.  Neck: Neck supple.  Cardiovascular: Normal rate and regular rhythm.  Pulmonary/Chest: Effort normal and breath sounds normal.  Abdominal: Soft. There is tenderness.  There is tenderness with palpation to the lower abdomen. No guarding or rebound.   Musculoskeletal: Normal range of motion.  Neurological: She is alert.  Skin: Skin is warm and dry.  Psychiatric: She has a normal mood and affect. Her behavior is normal.  Nursing note and vitals reviewed.    ED Treatments / Results  Labs (all labs ordered are listed, but only abnormal results are displayed) Labs Reviewed  COMPREHENSIVE METABOLIC PANEL - Abnormal; Notable for the following components:      Result Value   Potassium 3.4 (*)    All other components within normal limits  LIPASE, BLOOD  CBC  URINALYSIS, ROUTINE W REFLEX MICROSCOPIC  I-STAT BETA HCG BLOOD, ED ([facility], [facility], [facility] ONLY)    Radiology [place] Pelvic Complete With Transvaginal  Result Date: 12/17/2017 CLINICAL DATA:  Pelvic pain for 4 days.  LMP 5 years ago. EXAM: TRANSABDOMINAL AND TRANSVAGINAL ULTRASOUND OF PELVIS TECHNIQUE: Both transabdominal and transvaginal ultrasound examinations of the pelvis were performed. Transabdominal technique was performed for global imaging of the pelvis including uterus, ovaries, adnexal regions, and pelvic cul-de-sac. It was necessary to proceed with endovaginal exam following the transabdominal exam to visualize the uterus, endometrium, ovaries, adnexal  regions. COMPARISON:  CT of the abdomen and pelvis on 09/30/2017 FINDINGS: Uterus Measurements: At least 11.1 x 5.6 x 8.0 centimeters. Fluid-filled endometrium. Septate uterus. No discrete uterine mass. Endometrium Thickness: Double-layer thickness is 8.8 millimeters. Evaluation of the endometrium is limited by presence of significant fluid in the canal. Right ovary Measurements: 3.8 x 2.5 x 2.6 centimeters. Largest follicle is 2.0 centimeters. Left ovary Measurements: 3.3 x 1.4 x 2.2 centimeters. Normal appearance/no adnexal mass. Other findings No abnormal free fluid. IMPRESSION: 1. Uterus is distended with a large amount  of fluid, consistent with uterine synechiae or cervical stenosis. 2. Septate uterus. 3. Normal appearance of the ovaries. Electronically Signed   By: Norva Pavlov M.D.   On: 12/17/2017 16:14    Procedures Procedures (including critical care time)  Medications Ordered in ED Medications  ketorolac (TORADOL) injection 30 mg (30 mg Intramuscular Given 12/17/17 1437)   Consult with Dr. Claiborne Billings on call for Dr. Dareen Piano. She agrees with A/P.   Initial Impression / Assessment and Plan / ED Course  I have reviewed the triage vital signs and the nursing notes. 37 y.o. female here with pelvic pain stable for d/c with pain relieved with Toradol. Will give Naprosyn Rx and patient will call Dr. Dareen Piano for follow up.  Final Clinical Impressions(s) / ED Diagnoses   Final diagnoses:  Pelvic pain in female  Asherman syndrome    ED Discharge Orders        Ordered    naproxen (NAPROSYN) 500 MG tablet  2 times daily     12/17/17 1650       Damian Leavell [place], [place] 12/17/17 1656    Linwood Dibbles, MD 12/18/17 1626

## 2017-12-17 NOTE — ED Triage Notes (Signed)
Pt complains of pain in her uterus. Pt is to have hysterectomy, her gynecologist believes she has blocked ovaries. Pt states she has had pain for months but pain has been worse over the past 4 days. Pt states she feels numb in her face and bilateral arms. Pt last took Vicodin at 11AM today.

## 2017-12-17 NOTE — Discharge Instructions (Signed)
You may continue to take the Vicodin as needed in addition to the medication we give you. Call Dr. Ewell PoeAnderson's office for follow up. If you have increased pain or problems [facility].

## 2017-12-30 ENCOUNTER — Encounter: Payer: Self-pay | Admitting: Family Medicine

## 2018-01-12 NOTE — Patient Instructions (Addendum)
Your procedure is scheduled on: Thursday January 20, 2018 at 7:30 am  [facility] through [facility] at: 6:00 am  Pick up the phone at the desk and dial [phone].  Call this number if you have problems the morning of surgery: [phone].  Remember: Do NOT eat food or drink any liquids after: Midnight on Wednesday July 31  Take these medicines the morning of surgery with a SIP OF WATER: None  STOP ALL VITAMINS, SUPPLEMENTS, HERBAL MEDICATIONS NOW  DO NOT SMOKE DAY OF SURGERY  BRUSH YOUR TEETH DAY OF SURGERY  Do NOT wear jewelry (body piercing), metal hair clips/bobby pins, make-up, or nail polish. Do NOT wear lotions, powders, or perfumes.  You may wear deoderant. Do NOT shave for 48 hours prior to surgery. Do NOT bring valuables to the hospital. Contacts, dentures, or bridgework may not be worn into surgery. Leave suitcase in car.  After surgery it may be brought to your room.    For patients admitted to the hospital, checkout time is 11:00 AM the day of discharge.

## 2018-01-14 ENCOUNTER — Encounter (HOSPITAL_COMMUNITY)
Admission: RE | Admit: 2018-01-14 | Discharge: 2018-01-14 | Disposition: A | Discharge status: Home / Self Care | Payer: BLUE CROSS/BLUE SHIELD | Source: Ambulatory Visit | Attending: Obstetrics and Gynecology | Admitting: Obstetrics and Gynecology

## 2018-01-14 ENCOUNTER — Encounter (HOSPITAL_COMMUNITY): Payer: Self-pay

## 2018-01-14 ENCOUNTER — Other Ambulatory Visit: Payer: Self-pay

## 2018-01-14 DIAGNOSIS — Z01812 Encounter for preprocedural laboratory examination: Secondary | ICD-10-CM | POA: Diagnosis not present

## 2018-01-14 HISTORY — DX: Sleep apnea, unspecified: G47.30

## 2018-01-14 HISTORY — DX: Anxiety disorder, unspecified: F41.9

## 2018-01-14 LAB — CBC
HCT: 37.4 % (ref 36.0–46.0)
Hemoglobin: 12.5 g/dL (ref 12.0–15.0)
MCH: 32.9 pg (ref 26.0–34.0)
MCHC: 33.4 g/dL (ref 30.0–36.0)
MCV: 98.4 fL (ref 78.0–100.0)
PLATELETS: 259 10*3/uL (ref 150–400)
RBC: 3.8 MIL/uL — AB (ref 3.87–5.11)
RDW: 13.2 % (ref 11.5–15.5)
WBC: 5.8 10*3/uL (ref 4.0–10.5)

## 2018-01-19 NOTE — Anesthesia Preprocedure Evaluation (Addendum)
Anesthesia Evaluation  Patient identified by MRN, date of birth, ID band Patient awake    Reviewed: Allergy & Precautions, NPO status , Patient's Chart, lab work & pertinent test results  Airway Mallampati: I  TM Distance: >3 FB Neck ROM: Full    Dental no notable dental hx.    Pulmonary former smoker,  Pneumothorax on left x 2 Blebs    Pulmonary exam normal breath sounds clear to auscultation       Cardiovascular negative cardio ROS Normal cardiovascular exam Rhythm:Regular Rate:Normal     Neuro/Psych  Headaches, PSYCHIATRIC DISORDERS Anxiety Depression    GI/Hepatic Neg liver ROS, GERD  Controlled,  Endo/Other  negative endocrine ROS  Renal/GU negative Renal ROS     Musculoskeletal negative musculoskeletal ROS (+)   Abdominal   Peds  Hematology negative hematology ROS (+)   Anesthesia Other Findings HEMATOMETRA  Reproductive/Obstetrics                            Anesthesia Physical Anesthesia Plan  ASA: I  Anesthesia Plan: General   Post-op Pain Management:    Induction: Intravenous  PONV Risk Score and Plan: 4 or greater and Scopolamine patch - Pre-op, Midazolam, Dexamethasone, Ondansetron and Treatment may vary due to age or medical condition  Airway Management Planned: Oral ETT  Additional Equipment:   Intra-op Plan:   Post-operative Plan: Extubation in OR  Informed Consent: I have reviewed the patients History and Physical, chart, labs and discussed the procedure including the risks, benefits and alternatives for the proposed anesthesia with the patient or authorized representative who has indicated his/her understanding and acceptance.   Dental advisory given  Plan Discussed with: CRNA  Anesthesia Plan Comments:         Anesthesia Quick Evaluation

## 2018-01-20 ENCOUNTER — Encounter (HOSPITAL_COMMUNITY): Payer: Self-pay

## 2018-01-20 ENCOUNTER — Ambulatory Visit (HOSPITAL_COMMUNITY): Payer: BLUE CROSS/BLUE SHIELD | Admitting: Anesthesiology

## 2018-01-20 ENCOUNTER — Encounter (HOSPITAL_COMMUNITY)
Admission: AD | Disposition: A | Discharge status: Home / Self Care | Payer: Self-pay | Source: Ambulatory Visit | Attending: Obstetrics and Gynecology

## 2018-01-20 ENCOUNTER — Other Ambulatory Visit: Payer: Self-pay

## 2018-01-20 ENCOUNTER — Observation Stay (HOSPITAL_COMMUNITY)
Admission: AD | Admit: 2018-01-20 | Discharge: 2018-01-21 | Disposition: A | Discharge status: Home / Self Care | Payer: BLUE CROSS/BLUE SHIELD | Source: Ambulatory Visit | Attending: Obstetrics and Gynecology | Admitting: Obstetrics and Gynecology

## 2018-01-20 DIAGNOSIS — N912 Amenorrhea, unspecified: Secondary | ICD-10-CM | POA: Diagnosis not present

## 2018-01-20 DIAGNOSIS — Z880 Allergy status to penicillin: Secondary | ICD-10-CM | POA: Insufficient documentation

## 2018-01-20 DIAGNOSIS — Z87891 Personal history of nicotine dependence: Secondary | ICD-10-CM | POA: Insufficient documentation

## 2018-01-20 DIAGNOSIS — Z791 Long term (current) use of non-steroidal anti-inflammatories (NSAID): Secondary | ICD-10-CM | POA: Insufficient documentation

## 2018-01-20 DIAGNOSIS — G4730 Sleep apnea, unspecified: Secondary | ICD-10-CM | POA: Insufficient documentation

## 2018-01-20 DIAGNOSIS — R102 Pelvic and perineal pain: Principal | ICD-10-CM | POA: Diagnosis present

## 2018-01-20 DIAGNOSIS — N857 Hematometra: Secondary | ICD-10-CM | POA: Insufficient documentation

## 2018-01-20 DIAGNOSIS — N710 Acute inflammatory disease of uterus: Secondary | ICD-10-CM | POA: Diagnosis not present

## 2018-01-20 HISTORY — PX: BILATERAL SALPINGECTOMY: SHX5743

## 2018-01-20 HISTORY — PX: VAGINAL HYSTERECTOMY: SHX2639

## 2018-01-20 SURGERY — HYSTERECTOMY, VAGINAL
Anesthesia: General | Laterality: Bilateral

## 2018-01-20 MED ORDER — FAMOTIDINE 20 MG PO TABS
20.0000 mg | ORAL_TABLET | Freq: Once | ORAL | Status: AC
  Administered 2018-01-20: 20 mg via ORAL

## 2018-01-20 MED ORDER — KETOROLAC TROMETHAMINE 30 MG/ML IJ SOLN
INTRAMUSCULAR | Status: AC
  Filled 2018-01-20: qty 1

## 2018-01-20 MED ORDER — CELECOXIB 200 MG PO CAPS
200.0000 mg | ORAL_CAPSULE | Freq: Once | ORAL | Status: AC | PRN
  Administered 2018-01-20: 200 mg via ORAL

## 2018-01-20 MED ORDER — DOCUSATE SODIUM 100 MG PO CAPS
100.0000 mg | ORAL_CAPSULE | Freq: Two times a day (BID) | ORAL | Status: DC
  Administered 2018-01-20 – 2018-01-21 (×2): 100 mg via ORAL
  Filled 2018-01-20 (×6): qty 1

## 2018-01-20 MED ORDER — ONDANSETRON HCL 4 MG/2ML IJ SOLN
INTRAMUSCULAR | Status: DC | PRN
  Administered 2018-01-20: 4 mg via INTRAVENOUS

## 2018-01-20 MED ORDER — OXYCODONE HCL 5 MG PO TABS: 5.0000 mg | ORAL_TABLET | Freq: Once | ORAL | Status: DC | PRN

## 2018-01-20 MED ORDER — DEXAMETHASONE SODIUM PHOSPHATE 10 MG/ML IJ SOLN
INTRAMUSCULAR | Status: DC | PRN
  Administered 2018-01-20: 4 mg via INTRAVENOUS

## 2018-01-20 MED ORDER — DEXAMETHASONE SODIUM PHOSPHATE 4 MG/ML IJ SOLN
INTRAMUSCULAR | Status: AC
  Filled 2018-01-20: qty 1

## 2018-01-20 MED ORDER — FENTANYL CITRATE (PF) 250 MCG/5ML IJ SOLN
INTRAMUSCULAR | Status: AC
  Filled 2018-01-20: qty 5

## 2018-01-20 MED ORDER — ACETAMINOPHEN 500 MG PO TABS
ORAL_TABLET | ORAL | Status: AC
Start: 2018-01-20 — End: 2018-01-20
  Filled 2018-01-20: qty 2

## 2018-01-20 MED ORDER — PHENYLEPHRINE 40 MCG/ML (10ML) SYRINGE FOR IV PUSH (FOR BLOOD PRESSURE SUPPORT)
PREFILLED_SYRINGE | INTRAVENOUS | Status: AC
  Filled 2018-01-20: qty 10

## 2018-01-20 MED ORDER — LIDOCAINE-EPINEPHRINE 1 %-1:100000 IJ SOLN
INTRAMUSCULAR | Status: DC | PRN
  Administered 2018-01-20: 15 mL

## 2018-01-20 MED ORDER — PROPOFOL 10 MG/ML IV BOLUS
INTRAVENOUS | Status: DC | PRN
  Administered 2018-01-20: 150 mg via INTRAVENOUS

## 2018-01-20 MED ORDER — FAMOTIDINE 20 MG PO TABS
ORAL_TABLET | ORAL | Status: AC
  Filled 2018-01-20: qty 1

## 2018-01-20 MED ORDER — PROMETHAZINE HCL 25 MG/ML IJ SOLN: 6.2500 mg | INTRAMUSCULAR | Status: DC | PRN

## 2018-01-20 MED ORDER — ONDANSETRON HCL 4 MG/2ML IJ SOLN
4.0000 mg | Freq: Four times a day (QID) | INTRAMUSCULAR | Status: DC | PRN
  Administered 2018-01-20 (×2): 4 mg via INTRAVENOUS
  Filled 2018-01-20 (×2): qty 2

## 2018-01-20 MED ORDER — LACTATED RINGERS IV SOLN
INTRAVENOUS | Status: DC
  Administered 2018-01-20: 08:00:00 via INTRAVENOUS
  Administered 2018-01-20: 125 mL/h via INTRAVENOUS

## 2018-01-20 MED ORDER — ROCURONIUM BROMIDE 100 MG/10ML IV SOLN
INTRAVENOUS | Status: DC | PRN
  Administered 2018-01-20: 40 mg via INTRAVENOUS

## 2018-01-20 MED ORDER — ACETAMINOPHEN 500 MG PO TABS
1000.0000 mg | ORAL_TABLET | Freq: Once | ORAL | Status: AC
  Administered 2018-01-20: 1000 mg via ORAL

## 2018-01-20 MED ORDER — ONDANSETRON HCL 4 MG PO TABS: 4.0000 mg | ORAL_TABLET | Freq: Four times a day (QID) | ORAL | Status: DC | PRN

## 2018-01-20 MED ORDER — LIDOCAINE HCL (CARDIAC) PF 100 MG/5ML IV SOSY
PREFILLED_SYRINGE | INTRAVENOUS | Status: AC
  Filled 2018-01-20: qty 5

## 2018-01-20 MED ORDER — OXYCODONE-ACETAMINOPHEN 5-325 MG PO TABS
2.0000 | ORAL_TABLET | ORAL | Status: DC | PRN
  Administered 2018-01-20 – 2018-01-21 (×2): 2 via ORAL
  Filled 2018-01-20 (×2): qty 2

## 2018-01-20 MED ORDER — ROCURONIUM BROMIDE 100 MG/10ML IV SOLN
INTRAVENOUS | Status: AC
  Filled 2018-01-20: qty 1

## 2018-01-20 MED ORDER — SCOPOLAMINE 1 MG/3DAYS TD PT72
MEDICATED_PATCH | TRANSDERMAL | Status: AC
  Administered 2018-01-20: 1.5 mg via TRANSDERMAL
  Filled 2018-01-20: qty 1

## 2018-01-20 MED ORDER — LIDOCAINE-EPINEPHRINE 1 %-1:100000 IJ SOLN
INTRAMUSCULAR | Status: AC
  Filled 2018-01-20: qty 1

## 2018-01-20 MED ORDER — OXYCODONE HCL 5 MG/5ML PO SOLN: 5.0000 mg | Freq: Once | ORAL | Status: DC | PRN

## 2018-01-20 MED ORDER — SCOPOLAMINE 1 MG/3DAYS TD PT72
1.0000 | MEDICATED_PATCH | Freq: Once | TRANSDERMAL | Status: DC
  Administered 2018-01-20: 1.5 mg via TRANSDERMAL

## 2018-01-20 MED ORDER — SUGAMMADEX SODIUM 200 MG/2ML IV SOLN
INTRAVENOUS | Status: AC
  Filled 2018-01-20: qty 2

## 2018-01-20 MED ORDER — MIDAZOLAM HCL 2 MG/2ML IJ SOLN
INTRAMUSCULAR | Status: DC | PRN
  Administered 2018-01-20: 2 mg via INTRAVENOUS

## 2018-01-20 MED ORDER — HYDROMORPHONE HCL 1 MG/ML IJ SOLN
0.2500 mg | INTRAMUSCULAR | Status: DC | PRN
  Administered 2018-01-20 (×3): 0.5 mg via INTRAVENOUS

## 2018-01-20 MED ORDER — PROPOFOL 10 MG/ML IV BOLUS
INTRAVENOUS | Status: AC
  Filled 2018-01-20: qty 20

## 2018-01-20 MED ORDER — ONDANSETRON HCL 4 MG/2ML IJ SOLN
INTRAMUSCULAR | Status: AC
  Filled 2018-01-20: qty 2

## 2018-01-20 MED ORDER — FENTANYL CITRATE (PF) 100 MCG/2ML IJ SOLN
INTRAMUSCULAR | Status: DC | PRN
  Administered 2018-01-20: 25 ug via INTRAVENOUS
  Administered 2018-01-20: 50 ug via INTRAVENOUS
  Administered 2018-01-20: 25 ug via INTRAVENOUS
  Administered 2018-01-20 (×2): 50 ug via INTRAVENOUS

## 2018-01-20 MED ORDER — MIDAZOLAM HCL 2 MG/2ML IJ SOLN
INTRAMUSCULAR | Status: AC
  Filled 2018-01-20: qty 2

## 2018-01-20 MED ORDER — SIMETHICONE 80 MG PO CHEW: 80.0000 mg | CHEWABLE_TABLET | Freq: Four times a day (QID) | ORAL | Status: DC | PRN

## 2018-01-20 MED ORDER — CLINDAMYCIN PHOSPHATE 900 MG/50ML IV SOLN
900.0000 mg | Freq: Once | INTRAVENOUS | Status: AC
  Administered 2018-01-20: 900 mg via INTRAVENOUS
  Filled 2018-01-20: qty 50

## 2018-01-20 MED ORDER — SUGAMMADEX SODIUM 200 MG/2ML IV SOLN
INTRAVENOUS | Status: DC | PRN
  Administered 2018-01-20: 123.4 mg via INTRAVENOUS

## 2018-01-20 MED ORDER — CELECOXIB 200 MG PO CAPS
ORAL_CAPSULE | ORAL | Status: AC
  Filled 2018-01-20: qty 1

## 2018-01-20 MED ORDER — HYDROMORPHONE HCL 1 MG/ML IJ SOLN
INTRAMUSCULAR | Status: AC
  Filled 2018-01-20: qty 0.5

## 2018-01-20 MED ORDER — GENTAMICIN SULFATE 40 MG/ML IJ SOLN
INTRAVENOUS | Status: AC
  Administered 2018-01-20: 308.5 mg via INTRAVENOUS
  Filled 2018-01-20: qty 7.75

## 2018-01-20 MED ORDER — DEXTROSE IN LACTATED RINGERS 5 % IV SOLN
INTRAVENOUS | Status: DC
  Administered 2018-01-20 – 2018-01-21 (×3): via INTRAVENOUS

## 2018-01-20 MED ORDER — HYDROMORPHONE HCL 1 MG/ML IJ SOLN
INTRAMUSCULAR | Status: AC
  Administered 2018-01-20: 1 mg
  Filled 2018-01-20: qty 1

## 2018-01-20 MED ORDER — HYDROMORPHONE HCL 1 MG/ML IJ SOLN
1.0000 mg | Freq: Once | INTRAMUSCULAR | Status: DC
Start: 2018-01-20 — End: 2018-01-21
  Filled 2018-01-20 (×2): qty 1

## 2018-01-20 MED ORDER — LIDOCAINE HCL (CARDIAC) PF 100 MG/5ML IV SOSY
PREFILLED_SYRINGE | INTRAVENOUS | Status: DC | PRN
  Administered 2018-01-20: 100 mg via INTRAVENOUS

## 2018-01-20 MED ORDER — KETOROLAC TROMETHAMINE 30 MG/ML IJ SOLN
INTRAMUSCULAR | Status: DC | PRN
  Administered 2018-01-20: 30 mg via INTRAVENOUS

## 2018-01-20 MED ORDER — BISACODYL 10 MG RE SUPP: 10.0000 mg | Freq: Every day | RECTAL | Status: DC | PRN

## 2018-01-20 MED ORDER — ACETAMINOPHEN 160 MG/5ML PO SOLN: 960.0000 mg | Freq: Once | ORAL | Status: AC

## 2018-01-20 SURGICAL SUPPLY — 24 items
CANISTER SUCT 3000ML PPV (MISCELLANEOUS) ×2 IMPLANT
CATH ROBINSON RED A/P 16FR (CATHETERS) ×2 IMPLANT
CONT PATH 16OZ SNAP LID 3702 (MISCELLANEOUS) IMPLANT
DECANTER SPIKE VIAL GLASS SM (MISCELLANEOUS) IMPLANT
GLOVE BIOGEL PI IND STRL 7.0 (GLOVE) ×1 IMPLANT
GLOVE BIOGEL PI INDICATOR 7.0 (GLOVE) ×1
GLOVE ECLIPSE 7.0 STRL STRAW (GLOVE) ×4 IMPLANT
GOWN STRL REUS W/TWL LRG LVL3 (GOWN DISPOSABLE) ×10 IMPLANT
NDL SPNL 18GX3.5 QUINCKE PK (NEEDLE) ×1 IMPLANT
NEEDLE SPNL 18GX3.5 QUINCKE PK (NEEDLE) ×2 IMPLANT
NS IRRIG 1000ML POUR BTL (IV SOLUTION) ×2 IMPLANT
PACK VAGINAL WOMENS (CUSTOM PROCEDURE TRAY) ×2 IMPLANT
PAD OB MATERNITY 4.3X12.25 (PERSONAL CARE ITEMS) ×2 IMPLANT
SPONGE LAP 4X18 RFD (DISPOSABLE) ×2 IMPLANT
SUT MNCRL 0 MO-4 VIOLET 18 CR (SUTURE) ×3 IMPLANT
SUT MNCRL 0 VIOLET 6X18 (SUTURE) ×1 IMPLANT
SUT MONOCRYL 0 6X18 (SUTURE) ×1
SUT MONOCRYL 0 MO 4 18  CR/8 (SUTURE) ×3
SUT VIC AB 0 CT1 27 (SUTURE) ×4
SUT VIC AB 0 CT1 27XBRD ANBCTR (SUTURE) ×2 IMPLANT
SUT VIC AB 2-0 CT1 27 (SUTURE) ×6
SUT VIC AB 2-0 CT1 TAPERPNT 27 (SUTURE) ×3 IMPLANT
TOWEL OR 17X24 6PK STRL BLUE (TOWEL DISPOSABLE) ×4 IMPLANT
TRAY FOLEY W/BAG SLVR 14FR (SET/KITS/TRAYS/PACK) ×2 IMPLANT

## 2018-01-20 NOTE — Anesthesia Postprocedure Evaluation (Signed)
Anesthesia Post Note  Patient: [name]  Procedure(s) Performed: HYSTERECTOMY VAGINAL (Bilateral ) BILATERAL SALPINGECTOMY (Bilateral )     Patient location during evaluation: PACU Anesthesia Type: General Level of consciousness: awake and alert Pain management: pain level controlled Vital Signs Assessment: post-procedure vital signs reviewed and stable Respiratory status: spontaneous breathing, nonlabored ventilation, respiratory function stable and patient connected to nasal cannula oxygen Cardiovascular status: blood pressure returned to baseline and stable Postop Assessment: no apparent nausea or vomiting Anesthetic complications: no    Last Vitals:  Vitals:   01/20/18 1105 01/20/18 1150  BP: (!) 99/49 (!) 94/56  Pulse: 77 (!) 58  Resp: 14 18  Temp: 36.8 C 36.9 C  SpO2: 100% 100%    Last Pain:  Vitals:   01/20/18 1150  TempSrc: Oral  PainSc:    Pain Goal: Patients Stated Pain Goal: 5 (01/20/18 1150)               Ishitha Roper P Day Greb

## 2018-01-20 NOTE — Transfer of Care (Signed)
Immediate Anesthesia Transfer of Care Note  Patient: [name]  Procedure(s) Performed: HYSTERECTOMY VAGINAL (Bilateral ) BILATERAL SALPINGECTOMY (Bilateral )  Patient Location: PACU  Anesthesia Type:General  Level of Consciousness: awake, alert  and oriented  Airway & Oxygen Therapy: Patient Spontanous Breathing and Patient connected to nasal cannula oxygen  Post-op Assessment: Report given to RN, Post -op Vital signs reviewed and stable and Patient moving all extremities  Post vital signs: Reviewed and stable  Last Vitals:  Vitals Value Taken Time  BP 114/65 01/20/2018  9:07 AM  Temp    Pulse 95 01/20/2018  9:12 AM  Resp 18 01/20/2018  9:12 AM  SpO2 100 % 01/20/2018  9:12 AM  Vitals shown include unvalidated device data.  Last Pain:  Vitals:   01/20/18 0615  TempSrc: Oral  PainSc: 0-No pain      Patients Stated Pain Goal: 5 (01/20/18 0615)  Complications: No apparent anesthesia complications

## 2018-01-20 NOTE — Op Note (Signed)
NAME: [name], Mysha C. MEDICAL RECORD [id number] ACCOUNT [id number] DATE OF BIRTH:25-Sep-1980 FACILITY: [facility] LOCATION: [facility] Evert KohlPHYSICIAN:Tequan Redmon E. Yahel Fuston, MD  OPERATIVE REPORT  DATE OF PROCEDURE:  01/20/2018  PREOPERATIVE DIAGNOSES: 1.  Cyclic chronic pelvic pain. 2.  Hematometra.  POSTOPERATIVE DIAGNOSES:  1.  Cyclic chronic pelvic pain. 2.  Hematometra.  PROCEDURE: 1.  Total vaginal hysterectomy. 2.  Bilateral salpingectomy.  SURGEON:  Malva LimesMark Elizjah Noblet, MD  ASSISTANT:  Felipa Eveneriana Clark  ANESTHESIA:  General and local.  ANTIBIOTICS:  Clindamycin and gentamicin.  SPECIMENS:  Bilateral fallopian tubes, cervix and uterus and sent to pathology.  ESTIMATED BLOOD LOSS:  150 mL.  COMPLICATIONS:  None.  DESCRIPTION OF PROCEDURE:  The patient was taken to the operating room where she was placed in dorsal supine position.  A general anesthetic was administered without difficulty.  She was then placed in the dorsal lithotomy position.  She was prepped and  draped in the usual fashion for this procedure.  Exam under anesthesia revealed an anteverted uterus approximately 8 weeks in size with no evidence of adnexal masses.  Her bladder had been drained with a red rubber catheter.  A weighted speculum was  placed in the vagina.  A single-tooth tenaculum applied to the cervix.  Lidocaine 1% 20 mL with epinephrine was injected circumferentially around the cervix.  The posterior cul-de-sac was entered sharply.  The uterosacral ligaments were bilaterally  clamped, cut and ligated with 0 Monocryl suture.  The cervix was then circumscribed with the blade.  The anterior cul-de-sac was entered sharply.  The bladder pillars were bilaterally clamped, cut and ligated with 0 Monocryl suture.  Cardinal ligaments  were then serially clamped, cut and ligated with 0 Monocryl suture.  Uterine artery was bilaterally clamped, cut and ligated with 0 Monocryl suture.  The remaining broad ligament was clamped, cut  and ligated with 0 Monocryl suture bilaterally until the  level of the fallopian tube was reached.  It was noted on entering the posterior cul-de-sac that a Filshie clip was in the posterior cul-de-sac.  This was removed and sent to pathology.  It was later noted that this came from the patient's left fallopian  tube.  The uterus was then inverted, and the triple pedicle which included the ovarian ligament, round ligament, and fallopian tube were bilaterally clamped, cut and ligated x2 with 0 Monocryl suture.  The specimen was removed.  The fallopian tube on  the patient's right was then followed to its fimbriated end.  The mesosalpinx was clamped, cut and ligated with 0 Monocryl suture up to the level of the placement of the Filshie clip.  The Filshie clip was involved in a large vein, and therefore that  clip was left in place.  The entire tube on the patient's left was clamped, cut and ligated with 0 Monocryl suture.  The triple pedicle was examined and felt to be hemostatic.  At this point, all pedicles were examined and were hemostatic.  The posterior  cuff was closed using 2-0 Vicryl in a running locking fashion.  A support suture was placed by taking a suture through the cuff, out to the uterosacral ligaments, and then back out through the cuff, and this was ligated.  The remaining vaginal cuff was  closed in a vertical fashion using 0 Vicryl suture in a running locking fashion.  Hemostasis appeared to be adequate at the conclusion of the procedure.  The urine was clear after a Foley catheter was placed.  The patient was taken to  recovery room in  stable condition.  Instrument and lap counts were correct x3.  She will be admitted overnight hopefully and discharged in the morning.  LN/NUANCE  D:01/20/2018 T:01/20/2018 JOB:[id number]

## 2018-01-20 NOTE — Anesthesia Procedure Notes (Signed)
Procedure Name: Intubation Date/Time: 01/20/2018 7:29 AM Performed by: Hewitt Blade, CRNA Pre-anesthesia Checklist: Patient identified, Emergency Drugs available, Suction available and Patient being monitored Patient Re-evaluated:Patient Re-evaluated prior to induction Oxygen Delivery Method: Circle system utilized Preoxygenation: Pre-oxygenation with 100% oxygen Induction Type: IV induction Ventilation: Mask ventilation without difficulty Laryngoscope Size: Mac and 3 Grade View: Grade I Tube type: Oral Tube size: 7.0 mm Number of attempts: 1 Airway Equipment and Method: Stylet Placement Confirmation: ETT inserted through vocal cords under direct vision,  positive ETCO2 and breath sounds checked- equal and bilateral Secured at: 21 cm Tube secured with: Tape Dental Injury: Teeth and Oropharynx as per pre-operative assessment

## 2018-01-20 NOTE — H&P (Signed)
[name] is an 37 y.o. [id number] black female who presents c/o recurrent cyclic LAP. She underwent an ablation in 2014 for menorrhagia. She has been amenorrheic since that time. She began to have pelvic pain 6-8 months ago. She had a CT scan which indicated that there is a fluid collection c/w a hematometria. Her adnexa are wnl. She had a GI work up and they did not believe that the source of the pain was GI.Marland Kitchen. She had no improvement with Levbid   Past Medical History:  Diagnosis Date  . Allergy   . Anxiety   . Depression   . Frequent headaches   . GERD (gastroesophageal reflux disease)   . Headache(784.0)    otc med prn  . History of tobacco abuse   . Hx of cervical biopsy   . Hx of chlamydia infection   . Hx of gonorrhea   . Hx of left breast biopsy   . Hx of varicella   . Migraines   . Pneumothorax on left   . Sleep apnea     Past Surgical History:  Procedure Laterality Date  . biopsy of cervix    . BREAST BIOPSY  2005   cyst removed L breast  . COLPOSCOPY VULVA W/ BIOPSY     Normal results  . insertion of left chest tube  01/20/11   Dr Donata ClayVan Trigt  . NOVASURE ABLATION N/A 04/06/2013   Procedure: NOVASURE ABLATION;  Surgeon: Levi AlandMark E Anderson, MD;  Location: [facility];  Service: Gynecology;  Laterality: N/A;  . PLEURAL SCARIFICATION  2012 & 2013  . TUBAL LIGATION  07/05/2012   Procedure: POST PARTUM TUBAL LIGATION;  Surgeon: Mickel Baasichard D Kaplan, MD;  Location: [facility];  Service: Gynecology;  Laterality: Bilateral;  . VIDEO ASSISTED THORACOSCOPY  08/24/2011   Procedure: VIDEO ASSISTED THORACOSCOPY;  Surgeon: Norton Blizzard Patrick Burney, MD;  [facility];  Service: Thoracic;  Laterality: Left;  Left Video assisted thorascopic surgery , RESECTION OF APICIAL BLEB WITH PLEURODESIS    Family History  Problem Relation Age of Onset  . Alcohol abuse Mother   . Depression Mother   . Drug abuse Mother   . Depression Brother   . Depression Brother   . Cancer Paternal Grandmother   . Mental  illness Paternal Grandmother   . Other Neg Hx    Social History:  reports that she quit smoking about 7 years ago. Her smoking use included cigarettes. She smoked 0.50 packs per day. She has never used smokeless tobacco. She reports that she drinks alcohol. She reports that she does not use drugs.  Allergies:  Allergies  Allergen Reactions  . Penicillins Rash    Family h/o allergy. Can tolerate amoxicillin ok. Has patient had a PCN reaction causing immediate rash, facial/tongue/throat swelling, SOB or lightheadedness with hypotension: Yes Has patient had a PCN reaction causing severe rash involving mucus membranes or skin necrosis: Unknown: Has patient had a PCN reaction that required hospitalization: No Has patient had a PCN reaction occurring within the last 10 years: No If all of the above answers are "NO", then may proceed with Cephalosporin use.     Medications Prior to Admission  Medication Sig Dispense Refill  . naproxen sodium (ALEVE) 220 MG tablet Take 440 mg by mouth 2 (two) times daily as needed (for pain.).    Marland Kitchen. naproxen (NAPROSYN) 500 MG tablet Take 1 tablet (500 mg total) by mouth 2 (two) times daily. (Patient not taking: Reported on 01/10/2018) 30 tablet  0       Blood pressure 113/79, pulse 71, temperature 98 F (36.7 C), temperature source Oral, resp. rate 16, SpO2 100 %. General appearance: alert and cooperative Abdomen: soft, non-tender; bowel sounds normal; no masses,  no organomegaly Pelvic: cervix normal in appearance, external genitalia normal, no adnexal masses or tenderness, rectovaginal septum normal, uterus normal size, shape, and consistency and vagina normal without discharge Uterus anteverted and slightly tender No adnexal masses  Lab Results  Component Value Date   WBC 5.8 01/14/2018   HGB 12.5 01/14/2018   HCT 37.4 01/14/2018   MCV 98.4 01/14/2018   PLT 259 01/14/2018   Lab Results  Component Value Date   PREGTESTUR NEGATIVE 04/08/2015   HCG  <5.0 12/17/2017    Patient Active Problem List   Diagnosis Date Noted  . Abdominal pain 09/16/2017  . Encounter for health maintenance examination with abnormal findings 08/30/2017  . Right lower quadrant abdominal pain 08/30/2017  . LSIL (low grade squamous intraepithelial lesion) on Pap smear 07/27/2011  . Pneumothorax on left   . Hx of left breast biopsy   . Hx of cervical biopsy   . History of tobacco abuse    IMP/Recurrent cyclic pelvic pain        Suspected hematometria Plan/ To OR for TVH possible bilateral salpingectomy  ANDERSON,MARK E 01/20/2018, 7:18 AM

## 2018-01-21 ENCOUNTER — Encounter (HOSPITAL_COMMUNITY): Payer: Self-pay | Admitting: Obstetrics and Gynecology

## 2018-01-21 DIAGNOSIS — Z880 Allergy status to penicillin: Secondary | ICD-10-CM | POA: Diagnosis not present

## 2018-01-21 DIAGNOSIS — Z87891 Personal history of nicotine dependence: Secondary | ICD-10-CM | POA: Diagnosis not present

## 2018-01-21 DIAGNOSIS — G4730 Sleep apnea, unspecified: Secondary | ICD-10-CM | POA: Diagnosis not present

## 2018-01-21 DIAGNOSIS — N857 Hematometra: Secondary | ICD-10-CM | POA: Diagnosis not present

## 2018-01-21 DIAGNOSIS — Z791 Long term (current) use of non-steroidal anti-inflammatories (NSAID): Secondary | ICD-10-CM | POA: Diagnosis not present

## 2018-01-21 DIAGNOSIS — R102 Pelvic and perineal pain: Secondary | ICD-10-CM | POA: Diagnosis not present

## 2018-01-21 DIAGNOSIS — N912 Amenorrhea, unspecified: Secondary | ICD-10-CM | POA: Diagnosis not present

## 2018-01-21 LAB — CBC
HCT: 29.3 % — ABNORMAL LOW (ref 36.0–46.0)
Hemoglobin: 10.2 g/dL — ABNORMAL LOW (ref 12.0–15.0)
MCH: 33.9 pg (ref 26.0–34.0)
MCHC: 34.8 g/dL (ref 30.0–36.0)
MCV: 97.3 fL (ref 78.0–100.0)
Platelets: 163 10*3/uL (ref 150–400)
RBC: 3.01 MIL/uL — AB (ref 3.87–5.11)
RDW: 13 % (ref 11.5–15.5)
WBC: 8 10*3/uL (ref 4.0–10.5)

## 2018-01-21 MED ORDER — ONDANSETRON HCL 4 MG/2ML IJ SOLN: 4.0000 mg | Freq: Four times a day (QID) | INTRAMUSCULAR | 0 refills | Status: DC | PRN

## 2018-01-21 MED ORDER — OXYCODONE-ACETAMINOPHEN 5-325 MG PO TABS: 2.0000 | ORAL_TABLET | ORAL | 0 refills | Status: DC | PRN

## 2018-01-21 MED ORDER — SIMETHICONE 80 MG PO CHEW: 80.0000 mg | CHEWABLE_TABLET | Freq: Four times a day (QID) | ORAL | 0 refills | Status: DC | PRN

## 2018-01-21 MED ORDER — DOCUSATE SODIUM 100 MG PO CAPS: 100.0000 mg | ORAL_CAPSULE | Freq: Two times a day (BID) | ORAL | 0 refills | Status: DC

## 2018-01-21 MED ORDER — ONDANSETRON HCL 4 MG PO TABS: 4.0000 mg | ORAL_TABLET | Freq: Four times a day (QID) | ORAL | 0 refills | Status: DC | PRN

## 2018-01-21 NOTE — Progress Notes (Signed)
Pt discharged with reading instructions. Pt voiced understanding. No questions/concerns at this time.   Charnetta Wulff, RN 

## 2018-01-21 NOTE — Progress Notes (Signed)
Pt discharged with reading instructions. Pt voiced understanding. No questions/concerns at this time.   Adah Perlhandra Minnah Llamas, RN

## 2018-01-21 NOTE — Discharge Summary (Signed)
  Pt was admitted to have a TVH secondary to chronic cyclic pelvic pain felt secondary to a hematometria. She underwent a TVH with bilateral salpingectomy. Please see op note for complete description. She had an uncomplicated post op course. Vitals remained stable, tolerated diet, adequate pain control. Post op Hgb 10 pre 12. Pt with a soft abd and min vaginal spotting Pt discharged to home and instructed to return to office in 2-3 wks. Path pending

## 2018-01-21 NOTE — Progress Notes (Signed)
POD#1  Pt states that she required a percocet this am. She has a small amt of vaginal spotting. Tolerated sandwich yesterday.  VSSAF IMP/ POD#1 stable Plan/ Will plan on discharge later today.

## 2018-03-07 DIAGNOSIS — R350 Frequency of micturition: Secondary | ICD-10-CM | POA: Diagnosis not present

## 2018-03-07 DIAGNOSIS — Z681 Body mass index (BMI) 19 or less, adult: Secondary | ICD-10-CM | POA: Diagnosis not present

## 2018-03-21 DIAGNOSIS — N760 Acute vaginitis: Secondary | ICD-10-CM | POA: Diagnosis not present

## 2018-03-21 DIAGNOSIS — N898 Other specified noninflammatory disorders of vagina: Secondary | ICD-10-CM | POA: Diagnosis not present

## 2018-03-21 DIAGNOSIS — Z681 Body mass index (BMI) 19 or less, adult: Secondary | ICD-10-CM | POA: Diagnosis not present

## 2018-03-21 DIAGNOSIS — Z113 Encounter for screening for infections with a predominantly sexual mode of transmission: Secondary | ICD-10-CM | POA: Diagnosis not present

## 2018-06-28 DIAGNOSIS — B373 Candidiasis of vulva and vagina: Secondary | ICD-10-CM | POA: Diagnosis not present

## 2018-06-28 DIAGNOSIS — Z6820 Body mass index (BMI) 20.0-20.9, adult: Secondary | ICD-10-CM | POA: Diagnosis not present

## 2018-08-11 IMAGING — CT CT ABD-PELV W/ CM
2 of 4 series · 16 of 46 positions shown, 18 images · IV contrast (iopamidol)
Comparison: None.

CLINICAL DATA: Right lower quadrant intermittent pain.

EXAM:
CT ABDOMEN AND PELVIS WITH CONTRAST
TECHNIQUE: Multidetector CT imaging of the abdomen and pelvis was performed
using the standard protocol following bolus administration of
intravenous contrast.
CONTRAST:  100mL ULZLHU-KDD IOPAMIDOL (ULZLHU-KDD) INJECTION 61%

[Series 2: abd/pel w · axial · 0.64mm/px · z∈[-445,-80]mm · 13 of 81 slices shown, 15 images]
[im 4/81  soft-tissue]
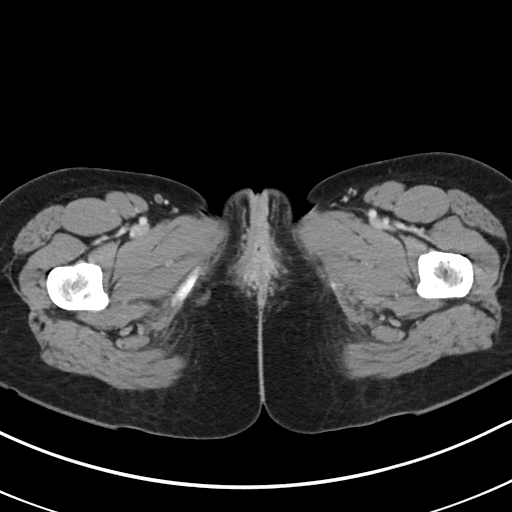
[im 4/81  bone]
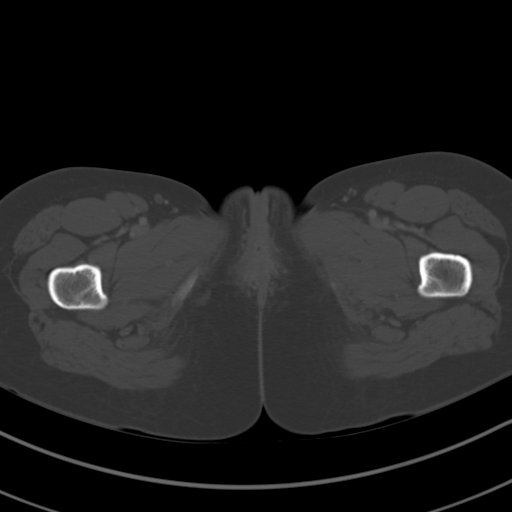
[im 11/81  soft-tissue]
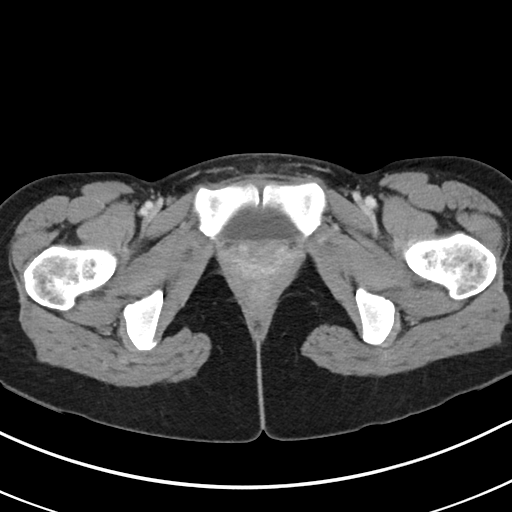
[im 17/81  soft-tissue]
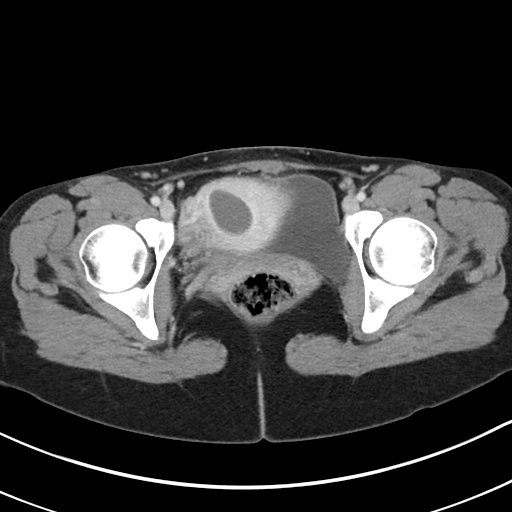
[im 24/81  soft-tissue]
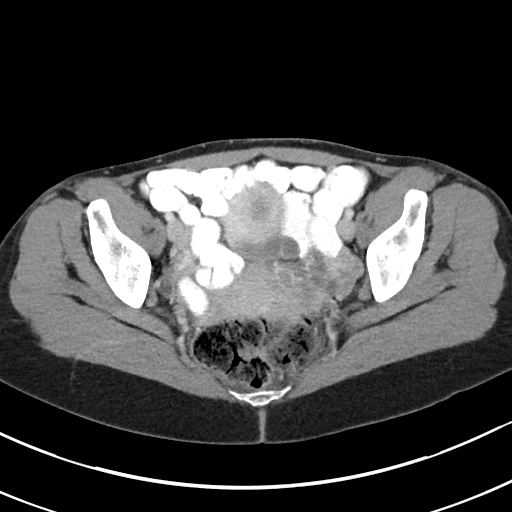
[im 27/81  soft-tissue]
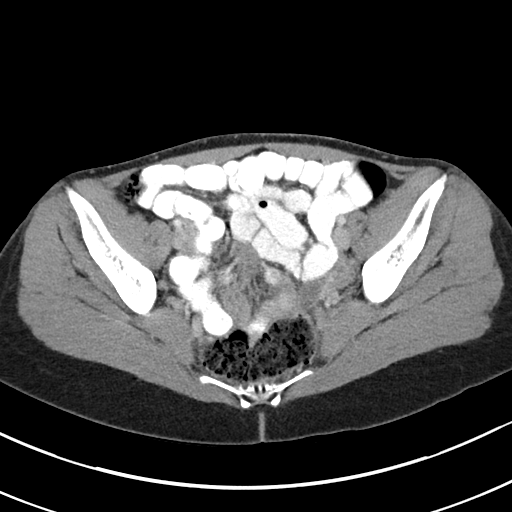
[im 34/81  soft-tissue]
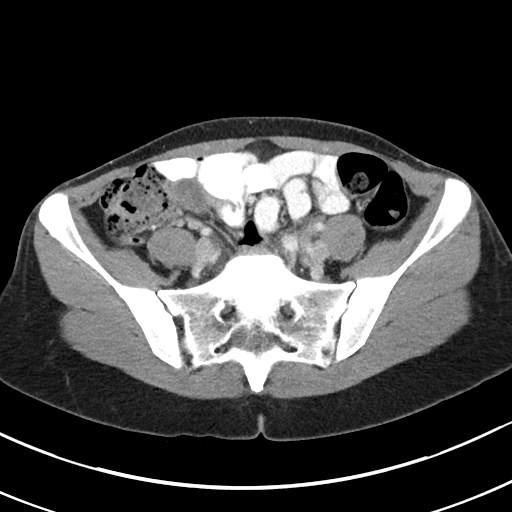
[im 41/81  soft-tissue]
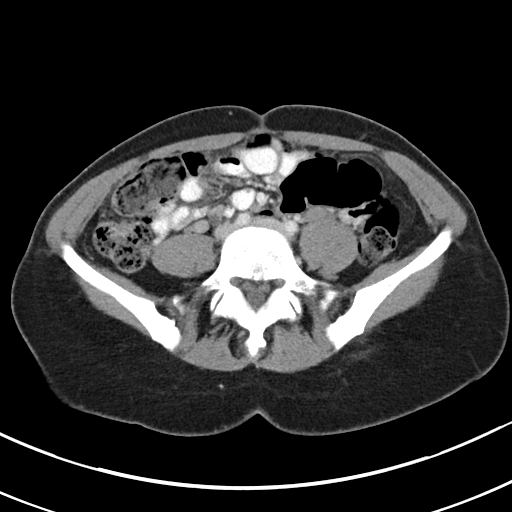
[im 47/81  soft-tissue]
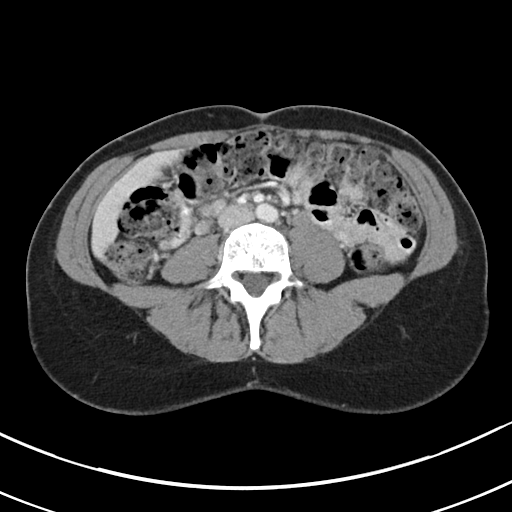
[im 54/81  soft-tissue]
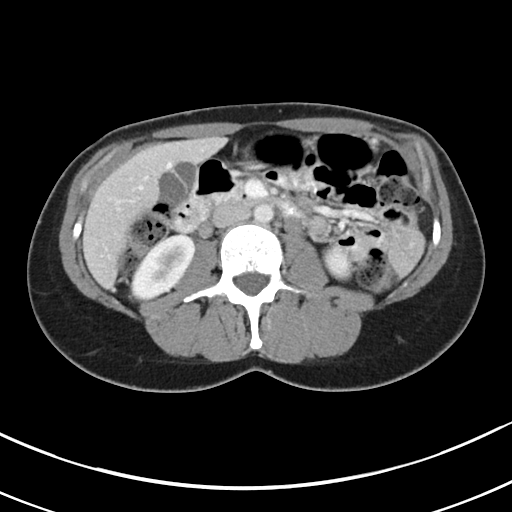
[im 54/81  bone]
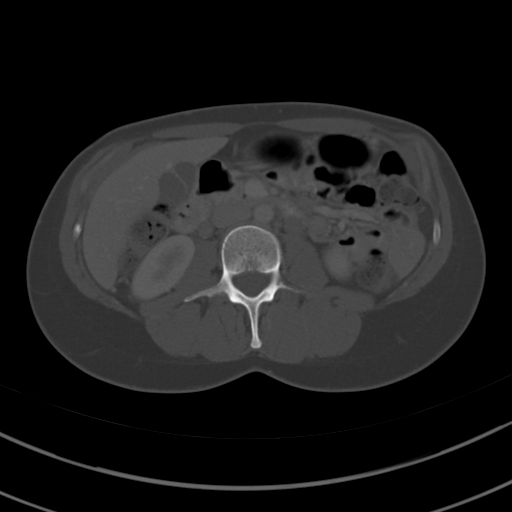
[im 57/81  soft-tissue]
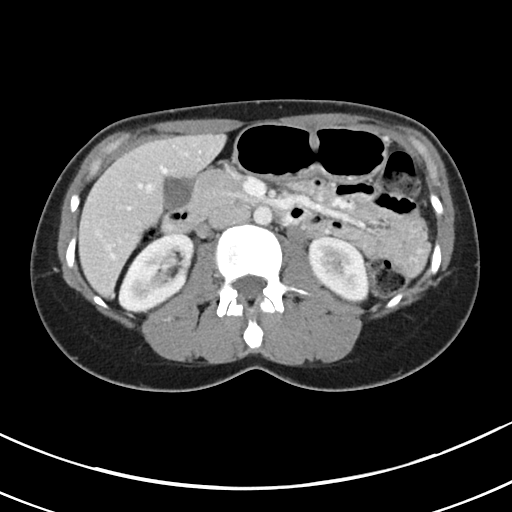
[im 64/81  soft-tissue]
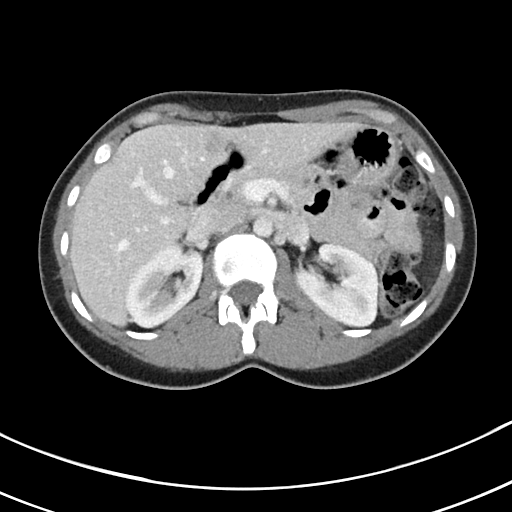
[im 71/81  soft-tissue]
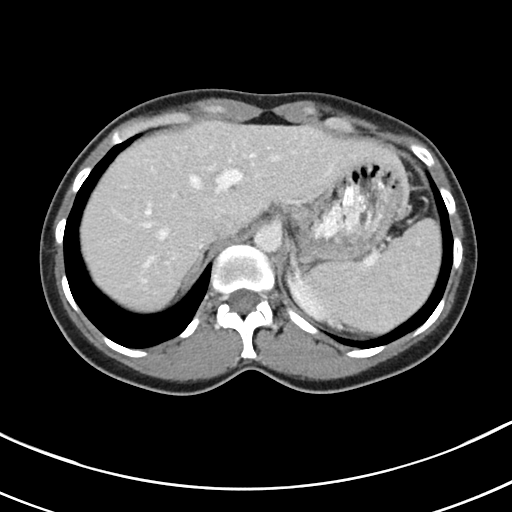
[im 77/81  soft-tissue]
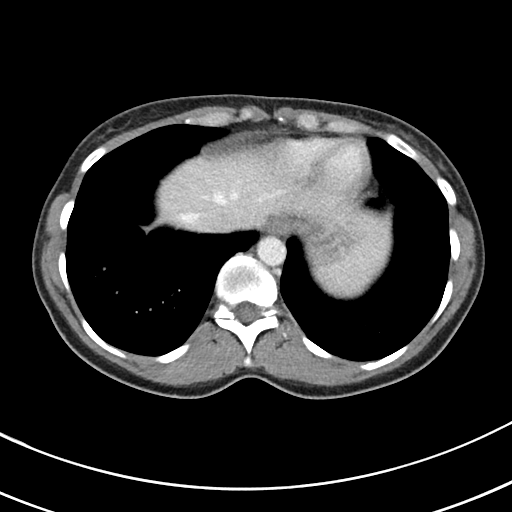

[Series 5: abd/pel w st · coronal · 0.62mm/px · 3 of 68 slices shown]
[im 23/68  soft-tissue]
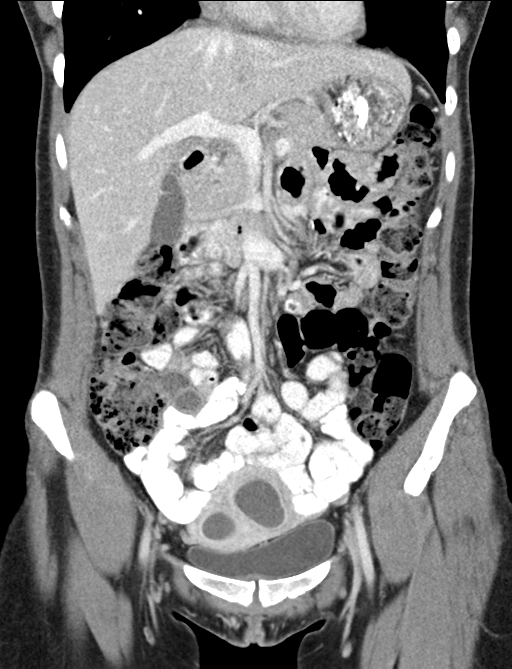
[im 30/68  soft-tissue]
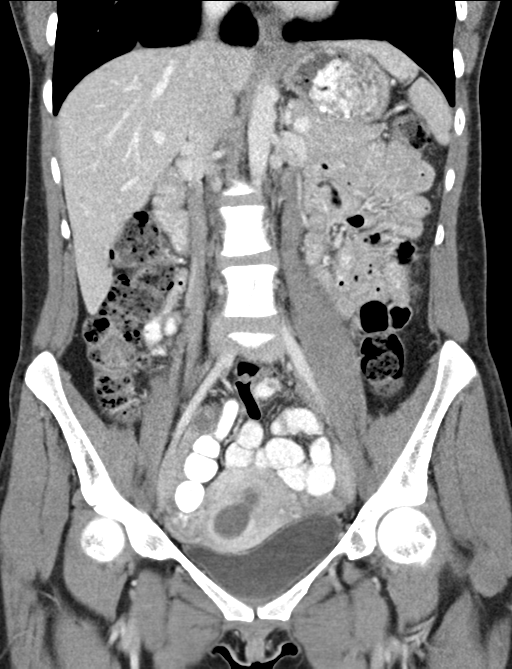
[im 38/68  soft-tissue]
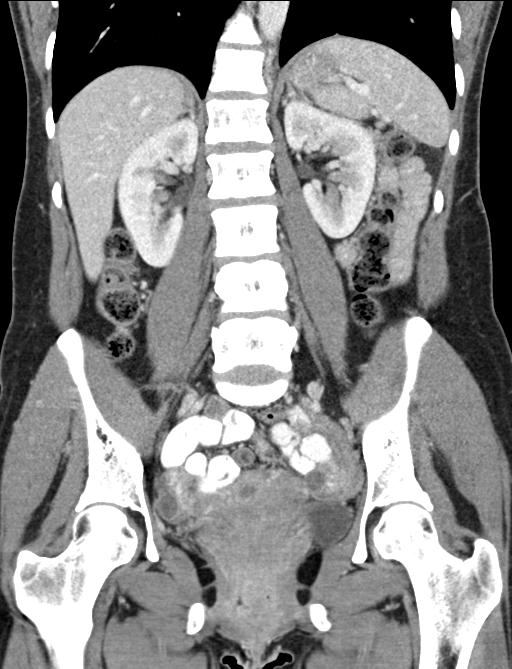

[16 of 46 positions shown; findings below may reference images not displayed]

FINDINGS: Lower chest: Lung bases are clear. No effusions. Heart is normal
size.

Hepatobiliary: No focal hepatic abnormality. Gallbladder
unremarkable.

Pancreas: No focal abnormality or ductal dilatation.

Spleen: No focal abnormality.  Normal size.

Adrenals/Urinary Tract: No adrenal abnormality. No focal renal
abnormality. No stones or hydronephrosis. Urinary bladder is
unremarkable.

Stomach/Bowel: Appendix is normal. Moderate stool burden throughout
the colon. Stomach and small bowel decompressed, unremarkable.

Vascular/Lymphatic: No evidence of aneurysm or adenopathy.

Reproductive: Bilobed fluid-filled area noted centrally within the
uterus. This is of unclear etiology. This presumably could reflect
cystic degeneration of a central fibroid or potentially fluid within
the endometrium. This could be further evaluated with pelvic
ultrasound. Small follicles in the ovaries bilaterally.

Other: No free fluid or free air.

Musculoskeletal: No acute bony abnormality.
IMPRESSION: Normal appendix.  Moderate stool burden throughout the colon.

Bilobed cystic appearing area centrally within the uterus of unknown
etiology. Differential considerations would include cystic
degeneration of a central fibroid or fluid within a distended
endometrial cavity. Consider further evaluation with pelvic
ultrasound.

## 2019-06-01 ENCOUNTER — Other Ambulatory Visit: Payer: Self-pay

## 2019-06-01 DIAGNOSIS — Z20822 Contact with and (suspected) exposure to covid-19: Secondary | ICD-10-CM

## 2019-06-04 LAB — NOVEL CORONAVIRUS, NAA: SARS-CoV-2, NAA: NOT DETECTED

## 2019-07-10 ENCOUNTER — Other Ambulatory Visit: Payer: Self-pay

## 2019-07-11 ENCOUNTER — Ambulatory Visit (INDEPENDENT_AMBULATORY_CARE_PROVIDER_SITE_OTHER): Payer: BC Managed Care – PPO | Admitting: Family Medicine

## 2019-07-11 ENCOUNTER — Other Ambulatory Visit: Payer: Self-pay

## 2019-07-11 ENCOUNTER — Encounter: Payer: Self-pay | Admitting: Family Medicine

## 2019-07-11 VITALS — BP 98/62 | HR 65 | Ht 69.0 in | Wt 150.0 lb

## 2019-07-11 DIAGNOSIS — M216X2 Other acquired deformities of left foot: Secondary | ICD-10-CM | POA: Diagnosis not present

## 2019-07-11 DIAGNOSIS — R202 Paresthesia of skin: Secondary | ICD-10-CM | POA: Diagnosis not present

## 2019-07-11 DIAGNOSIS — M216X1 Other acquired deformities of right foot: Secondary | ICD-10-CM | POA: Diagnosis not present

## 2019-07-11 LAB — CBC
HCT: 36 % (ref 36.0–46.0)
Hemoglobin: 12.1 g/dL (ref 12.0–15.0)
MCHC: 33.5 g/dL (ref 30.0–36.0)
MCV: 98.6 fl (ref 78.0–100.0)
Platelets: 253 10*3/uL (ref 150.0–400.0)
RBC: 3.65 Mil/uL — ABNORMAL LOW (ref 3.87–5.11)
RDW: 12.8 % (ref 11.5–15.5)
WBC: 4.6 10*3/uL (ref 4.0–10.5)

## 2019-07-11 LAB — B12 AND FOLATE PANEL
Folate: 11.5 ng/mL (ref >5.9)
Vitamin B-12: 527 pg/mL (ref 211–911)

## 2019-07-11 LAB — TSH: TSH: 1.37 u[IU]/mL (ref 0.35–4.50)

## 2019-07-11 MED ORDER — GABAPENTIN 300 MG PO CAPS: 300.0000 mg | ORAL_CAPSULE | Freq: Every day | ORAL | 3 refills | Status: DC

## 2019-07-11 NOTE — Patient Instructions (Addendum)
Thank you for coming in today.   Try gabapentin at bedtime.  Use the scaphoid pads (small) from [email or web address] Recheck in about 1 month.  Return sooner if needed.   Let me know if there is a problem.    Restless Legs Syndrome Restless legs syndrome is a condition that causes uncomfortable feelings or sensations in the legs, especially while sitting or lying down. The sensations usually cause an overwhelming urge to move the legs. The arms can also sometimes be affected. The condition can range from mild to severe. The symptoms often interfere with a person's ability to sleep. What are the causes? The cause of this condition is not known. What increases the risk? The following factors may make you more likely to develop this condition:  Being older than 50.  Pregnancy.  Being a woman. In general, the condition is more common in women than in men.  A family history of the condition.  Having iron deficiency.  Overuse of caffeine, nicotine, or alcohol.  Certain medical conditions, such as kidney disease, Parkinson's disease, or nerve damage.  Certain medicines, such as those for high blood pressure, nausea, colds, allergies, depression, and some heart conditions. What are the signs or symptoms? The main symptom of this condition is uncomfortable sensations in the legs, such as:  Pulling.  Tingling.  Prickling.  Throbbing.  Crawling.  Burning. Usually, the sensations:  Affect both sides of the body.  Are worse when you sit or lie down.  Are worse at night. These may wake you up or make it difficult to fall asleep.  Make you have a strong urge to move your legs.  Are temporarily relieved by moving your legs. The arms can also be affected, but this is rare. People who have this condition often have tiredness during the day because of their lack of sleep at night. How is this diagnosed? This condition may be diagnosed based on:  Your symptoms.  Blood tests. In  some cases, you may be monitored in a sleep lab by a specialist (a sleep study). This can detect any disruptions in your sleep. How is this treated? This condition is treated by managing the symptoms. This may include:  Lifestyle changes, such as exercising, using relaxation techniques, and avoiding caffeine, alcohol, or tobacco.  Medicines. Anti-seizure medicines may be tried first. Follow these instructions at home:     General instructions  Take over-the-counter and prescription medicines only as told by your health care provider.  Use methods to help relieve the uncomfortable sensations, such as: ? Massaging your legs. ? Walking or stretching. ? Taking a cold or hot bath.  Keep all follow-up visits as told by your health care provider. This is important. Lifestyle  Practice good sleep habits. For example, go to bed and get up at the same time every day. Most adults should get 7-9 hours of sleep each night.  Exercise regularly. Try to get at least 30 minutes of exercise most days of the week.  Practice ways of relaxing, such as yoga or meditation.  Avoid caffeine and alcohol.  Do not use any products that contain nicotine or tobacco, such as cigarettes and e-cigarettes. If you need help quitting, ask your health care provider. Contact a health care provider if:  Your symptoms get worse or they do not improve with treatment. Summary  Restless legs syndrome is a condition that causes uncomfortable feelings or sensations in the legs, especially while sitting or lying down.  The symptoms often interfere  with a person's ability to sleep.  This condition is treated by managing the symptoms. You may need to make lifestyle changes or take medicines. This information is not intended to replace advice given to you by your health care provider. Make sure you discuss any questions you have with your health care provider. Document Revised: 06/28/2017 Document Reviewed:  06/28/2017 Elsevier Patient Education  2020 [employer].

## 2019-07-11 NOTE — Progress Notes (Signed)
   Subjective:    I'm seeing this patient as a consultation for:  Dr. Doreene Burke. Note will be routed back to referring provider/PCP.  CC: B lower leg and feet paresthesias R>L  I, Molly Weber, LAT, ATC, am serving as scribe for Dr. Clementeen Graham.  HPI: Pt is a 39 y/o female presenting w/ c/o B lower leg and feet paresthesias w/ no known MOI.  She notes the numbness/tingling in her toes has been going on for approximately 2 months but the legs have been bothering her for a while.  She denies any low back pain. She states that the tingling is mainly at night when she's trying to sleep but now notes that her toes will go numb when she's up on her feet.  She works at [employer] and is on her feet all day.  She notes that her feet tend to flatten out with prolonged standing and she is taken to the habit of staying on her feet a bit supinated to compensate and she is not sure if that is a factor or not.  Aggravating factors:  Prolonged standing/walking and laying down to go to sleep Treatments tried:  Lower leg stretching/ROM; elevation  Past medical history, Surgical history, Family history, Social history, Allergies, and medications have been entered into the medical record, reviewed.   Review of Systems: No new headache, visual changes, nausea, vomiting, diarrhea, constipation, dizziness, abdominal pain, skin rash, fevers, chills, night sweats, weight loss, swollen lymph nodes, body aches, joint swelling, muscle aches, chest pain, shortness of breath, mood changes, visual or auditory hallucinations.   Objective:    Vitals:   07/11/19 1417  BP: 98/62  Pulse: 65  SpO2: 98%   General: Well Developed, well nourished, and in no acute distress.  Neuro/Psych: Alert and oriented x3, extra-ocular muscles intact, able to move all 4 extremities, sensation grossly intact. Skin: Warm and dry, no rashes noted.  Respiratory: Not using accessory muscles, speaking in full sentences, trachea midline.    Cardiovascular: Pulses palpable, no extremity edema. Abdomen: Does not appear distended. MSK:  L-spine: Normal.  Nontender.  Normal lumbar motion. Negative slump test bilaterally. Lower extremity strength reflexes and sensation are equal and normal throughout bilateral lower extremities.  Bilateral feet with pronation with standing.  Normal foot and ankle motion.    Impression and Recommendations:    Assessment and Plan: 39 y.o. female with  Bilateral lower extremity paresthesias.  Negative symptoms are characteristic for restless leg syndrome.  However this does not fully explain her symptoms that occur during the day.  Differential for that includes lumbar radiculopathy at L5 nerve root or more peripheral neuropathy.  Discussed options.  Await labs that were obtained at PCP office today including B12/folate panel, TSH, CBC.  Will treat symptomatically with gabapentin at bedtime.  Check back in 1 month.  Would consider additional evaluation including nerve conduction study/EMG if not improving.  Additionally patient was given small scaphoid pads to help correct her pronation issue with standing.  This may help some as well.  Meds ordered this encounter  Medications  . gabapentin (NEURONTIN) 300 MG capsule    Sig: Take 1 capsule (300 mg total) by mouth at bedtime. For nerve pain    Dispense:  90 capsule    Refill:  3    Discussed warning signs or symptoms. Please see discharge instructions. Patient expresses understanding.   The above documentation has been reviewed and is accurate and complete Clementeen Graham

## 2019-07-11 NOTE — Progress Notes (Signed)
Established Patient Office Visit  Subjective:  Patient ID: [name], female    DOB: [birth year]  Age: 39 y.o. MRN: [id number]  CC:  Chief Complaint  Patient presents with  . Numbness    c/o numbness in feet and legs seems to becoming worse.     HPI [name] presents for evaluation and treatment of paresthesias she has been experiencing in her lower legs and feet.  Right side seems to be affected more predominantly.  Denies back pain or radiation from her back.  No injury.  She finds her self moving her legs around at night but denies kicking the covers off or kicking her bed partners.  Past Medical History:  Diagnosis Date  . Allergy   . Anxiety   . Depression   . Frequent headaches   . GERD (gastroesophageal reflux disease)   . Headache(784.0)    otc med prn  . History of tobacco abuse   . Hx of cervical biopsy   . Hx of chlamydia infection   . Hx of gonorrhea   . Hx of left breast biopsy   . Hx of varicella   . Migraines   . Pneumothorax on left   . Sleep apnea     Past Surgical History:  Procedure Laterality Date  . BILATERAL SALPINGECTOMY Bilateral 01/20/2018   Procedure: BILATERAL SALPINGECTOMY;  Surgeon: Levi Aland, MD;  Location: [facility];  Service: Gynecology;  Laterality: Bilateral;  . biopsy of cervix    . BREAST BIOPSY  2005   cyst removed L breast  . COLPOSCOPY VULVA W/ BIOPSY     Normal results  . insertion of left chest tube  01/20/11   Dr Donata Clay  . NOVASURE ABLATION N/A 04/06/2013   Procedure: NOVASURE ABLATION;  Surgeon: Levi Aland, MD;  Location: [facility];  Service: Gynecology;  Laterality: N/A;  . PLEURAL SCARIFICATION  2012 & 2013  . TUBAL LIGATION  07/05/2012   Procedure: POST PARTUM TUBAL LIGATION;  Surgeon: Mickel Baas, MD;  Location: [facility];  Service: Gynecology;  Laterality: Bilateral;  . VAGINAL HYSTERECTOMY Bilateral 01/20/2018   Procedure: HYSTERECTOMY VAGINAL;  Surgeon: Levi Aland, MD;  Location: [facility];   Service: Gynecology;  Laterality: Bilateral;  . VIDEO ASSISTED THORACOSCOPY  08/24/2011   Procedure: VIDEO ASSISTED THORACOSCOPY;  Surgeon: Norton Blizzard, MD;  Location: [facility];  Service: Thoracic;  Laterality: Left;  Left Video assisted thorascopic surgery , RESECTION OF APICIAL BLEB WITH PLEURODESIS    Family History  Problem Relation Age of Onset  . Alcohol abuse Mother   . Depression Mother   . Drug abuse Mother   . Depression Brother   . Depression Brother   . Cancer Paternal Grandmother   . Mental illness Paternal Grandmother   . Other Neg Hx     Social History   Socioeconomic History  . Marital status: Single    Spouse name: Not on file  . Number of children: Not on file  . Years of education: Not on file  . Highest education level: Not on file  Occupational History    Employer: [employer]  Tobacco Use  . Smoking status: Former Smoker    Packs/day: 0.50    Types: Cigarettes    Quit date: 11/21/2010    Years since quitting: 8.6  . Smokeless tobacco: Never Used  Substance and Sexual Activity  . Alcohol use: Yes    Comment: socially  . Drug use:  No  . Sexual activity: Not Currently    Birth control/protection: Surgical    Comment: tubal  Other Topics Concern  . Not on file  Social History Narrative  . Not on file   Social Determinants of Health   Financial Resource Strain:   . Difficulty of Paying Living Expenses: Not on file  Food Insecurity:   . Worried About Charity fundraiser in the Last Year: Not on file  . Ran Out of Food in the Last Year: Not on file  Transportation Needs:   . Lack of Transportation (Medical): Not on file  . Lack of Transportation (Non-Medical): Not on file  Physical Activity:   . Days of Exercise per Week: Not on file  . Minutes of Exercise per Session: Not on file  Stress:   . Feeling of Stress : Not on file  Social Connections:   . Frequency of Communication with Friends and Family: Not on file  . Frequency of Social  Gatherings with Friends and Family: Not on file  . Attends Religious Services: Not on file  . Active Member of Clubs or Organizations: Not on file  . Attends Archivist Meetings: Not on file  . Marital Status: Not on file  Intimate Partner Violence:   . Fear of Current or Ex-Partner: Not on file  . Emotionally Abused: Not on file  . Physically Abused: Not on file  . Sexually Abused: Not on file    Outpatient Medications Prior to Visit  Medication Sig Dispense Refill  . docusate sodium (COLACE) 100 MG capsule Take 1 capsule (100 mg total) by mouth 2 (two) times daily. (Patient not taking: Reported on 07/11/2019) 10 capsule 0  . ondansetron (ZOFRAN) 4 MG tablet Take 1 tablet (4 mg total) by mouth every 6 (six) hours as needed for nausea. (Patient not taking: Reported on 07/11/2019) 20 tablet 0  . ondansetron (ZOFRAN) 4 MG/2ML SOLN injection Inject 2 mLs (4 mg total) into the vein every 6 (six) hours as needed for nausea. (Patient not taking: Reported on 07/11/2019) 2 mL 0  . oxyCODONE-acetaminophen (PERCOCET/ROXICET) 5-325 MG tablet Take 2 tablets by mouth every 4 (four) hours as needed for severe pain ((when tolerating fluids)). (Patient not taking: Reported on 07/11/2019) 30 tablet 0  . simethicone (MYLICON) 80 MG chewable tablet Chew 1 tablet (80 mg total) by mouth 4 (four) times daily as needed for flatulence. (Patient not taking: Reported on 07/11/2019) 30 tablet 0   No facility-administered medications prior to visit.    Allergies  Allergen Reactions  . Penicillins Rash    Family h/o allergy. Can tolerate amoxicillin ok. Has patient had a PCN reaction causing immediate rash, facial/tongue/throat swelling, SOB or lightheadedness with hypotension: Yes Has patient had a PCN reaction causing severe rash involving mucus membranes or skin necrosis: Unknown: Has patient had a PCN reaction that required hospitalization: No Has patient had a PCN reaction occurring within the last 10  years: No If all of the above answers are "NO", then may proceed with Cephalosporin use.     ROS Review of Systems  Constitutional: Negative.   HENT: Negative.   Eyes: Negative for photophobia and visual disturbance.  Respiratory: Negative.   Cardiovascular: Negative.   Gastrointestinal: Negative.   Genitourinary: Negative.   Musculoskeletal: Negative for back pain, gait problem, joint swelling and myalgias.  Skin: Negative for pallor and rash.  Neurological: Positive for numbness. Negative for weakness.      Objective:  Physical Exam  Constitutional: She is oriented to person, place, and time. She appears well-developed and well-nourished. No distress.  HENT:  Head: Normocephalic and atraumatic.  Right Ear: External ear normal.  Left Ear: External ear normal.  Eyes: Right eye exhibits no discharge. Left eye exhibits no discharge. No scleral icterus.  Neck: No JVD present. No tracheal deviation present.  Cardiovascular: Normal rate, regular rhythm and normal heart sounds.  Pulses:      Dorsalis pedis pulses are 2+ on the right side and 2+ on the left side.       Posterior tibial pulses are 2+ on the right side and 2+ on the left side.  Pulmonary/Chest: Effort normal and breath sounds normal. No stridor.  Musculoskeletal:     Right foot: Normal range of motion. No swelling, tenderness or bony tenderness.     Left foot: Normal range of motion. No swelling, tenderness or bony tenderness.       Feet:  Neurological: She is alert and oriented to person, place, and time.  Skin: Skin is warm and dry. She is not diaphoretic.  Psychiatric: She has a normal mood and affect.    BP 98/62   Pulse 65   Temp 98.1 F (36.7 C) (Tympanic)   Ht 5\' 9"  (1.753 m)   Wt 149 lb 3.2 oz (67.7 kg)   SpO2 99%   BMI 22.03 kg/m  Wt Readings from Last 3 Encounters:  07/11/19 149 lb 3.2 oz (67.7 kg)  01/20/18 136 lb (61.7 kg)  01/14/18 136 lb (61.7 kg)     Health Maintenance Due  Topic  Date Due  . TETANUS/TDAP  02/21/2002  . INFLUENZA VACCINE  01/21/2019  . PAP SMEAR-Modifier  06/10/2019    There are no preventive care reminders to display for this patient.  Lab Results  Component Value Date   TSH 1.58 10/04/2017   Lab Results  Component Value Date   WBC 8.0 01/21/2018   HGB 10.2 (L) 01/21/2018   HCT 29.3 (L) 01/21/2018   MCV 97.3 01/21/2018   PLT 163 01/21/2018   Lab Results  Component Value Date   NA 139 12/17/2017   K 3.4 (L) 12/17/2017   CO2 24 12/17/2017   GLUCOSE 97 12/17/2017   BUN 11 12/17/2017   CREATININE 0.87 12/17/2017   BILITOT 0.7 12/17/2017   ALKPHOS 46 12/17/2017   AST 23 12/17/2017   ALT 19 12/17/2017   PROT 7.4 12/17/2017   ALBUMIN 3.9 12/17/2017   CALCIUM 9.6 12/17/2017   ANIONGAP 8 12/17/2017   GFR 106.81 08/30/2017   Lab Results  Component Value Date   CHOL 165 08/30/2017   Lab Results  Component Value Date   HDL 69.40 08/30/2017   Lab Results  Component Value Date   LDLCALC 87 08/30/2017   Lab Results  Component Value Date   TRIG 43.0 08/30/2017   Lab Results  Component Value Date   CHOLHDL 2 08/30/2017   No results found for: HGBA1C    Assessment & Plan:   Problem List Items Addressed This Visit      Other   Left leg paresthesias   Relevant Orders   CBC   TSH   B12 and Folate Panel   Ambulatory referral to Sports Medicine      No orders of the defined types were placed in this encounter.  Right leg and foot are more predominantly affected.  Software is not transporting the  diagnosis.  Tinel's were positive on both  sides.  Discussed proper shoe wear with adequate arch support.  Sports medicine referral for confirmation of diagnosis and possible orthotics.  Lab work-up as above. Follow-up: No follow-ups on file.    Mliss Sax, MD

## 2019-07-11 NOTE — Patient Instructions (Signed)
Paresthesia Paresthesia is a burning or prickling feeling. This feeling can happen in any part of the body. It often happens in the hands, arms, legs, or feet. Usually, it is not painful. In most cases, the feeling goes away in a short time and is not a sign of a serious problem. If you have paresthesia that lasts a long time, you may need to be seen by your doctor. Follow these instructions at home: Alcohol use   Do not drink alcohol if: ? Your doctor tells you not to drink. ? You are pregnant, may be pregnant, or are planning to become pregnant.  If you drink alcohol: ? Limit how much you use to:  0-1 drink a day for women.  0-2 drinks a day for men. ? Be aware of how much alcohol is in your drink. In the [place] one drink equals one 12 oz bottle of beer (355 mL), one 5 oz glass of wine (148 mL), or one 1 oz glass of hard liquor (44 mL). Nutrition   Eat a healthy diet. This includes: ? Eating foods that have a lot of fiber in them, such as fresh fruits and vegetables, whole grains, and beans. ? Limiting foods that have a lot of fat and processed sugars in them, such as fried or sweet foods. General instructions  Take over-the-counter and prescription medicines only as told by your doctor.  Do not use any products that have nicotine or tobacco in them, such as cigarettes and e-cigarettes. If you need help quitting, ask your doctor.  If you have diabetes, work with your doctor to make sure your blood sugar stays in a healthy range.  If your feet feel numb: ? Check for redness, warmth, and swelling every day. ? Wear padded socks and comfortable shoes. These help protect your feet.  Keep all follow-up visits as told by your doctor. This is important. Contact a doctor if:  You have paresthesia that gets worse or does not go away.  Your burning or prickling feeling gets worse when you walk.  You have pain or cramps.  You feel dizzy.  You have a rash. Get help right away if  you:  Feel weak.  Have trouble walking or moving.  Have problems speaking, understanding, or seeing.  Feel confused.  Cannot control when you pee (urinate) or poop (have a bowel movement).  Lose feeling (have numbness) after an injury.  Have new weakness in an arm or leg.  Pass out (faint). Summary  Paresthesia is a burning or prickling feeling. It often happens in the hands, arms, legs, or feet.  In most cases, the feeling goes away in a short time and is not a sign of a serious problem.  If you have paresthesia that lasts a long time, you may need to be seen by your doctor. This information is not intended to replace advice given to you by your health care provider. Make sure you discuss any questions you have with your health care provider. Document Revised: 07/04/2018 Document Reviewed: 06/17/2017 Elsevier Patient Education  2020 [place].  High Arch Feet  A high arch foot is a condition in which the arch of the foot does not touch the ground when you are standing or walking. As a result, more weight is put on the parts of the foot that touch the ground, including the front, outer side, and heel of the foot. The medical term for this condition is cavus foot. It can affect one foot or  both feet. Having high arches can be painful and unstable. What are the causes? This condition may be caused by:  Being born with a high arch in one foot or both feet.  Being born with a foot that turns inward (club foot).  Having a broken foot that does not heal properly.  Having a nervous system (neurological) condition such as stroke, polio, cerebral palsy, or muscular dystrophy. In this case, high arches tend to get worse over time. Sometimes the cause of high arch foot is not known. What are the signs or symptoms? This condition can range from mild to severe. People with mild high arches may have few symptoms. More severe high arches may cause:  An unstable ankle with frequent  ankle sprains or strains.  Pain when walking or standing.  Calluses on the front, side, and heel of the foot.  Toe deformities.  The foot to drag (foot drop). This can happen if the high arch is caused by a neurological condition, which can cause the muscles that lift the foot to become weak. How is this diagnosed? This condition is diagnosed based on:  Your medical and family history.  A physical exam. Your health care provider will examine your feet and observe your feet while you walk and stand. Your provider may test your foot strength and movement. X-rays or other imaging tests may be done to learn more about your condition. If your health care provider suspects that you have a neurological condition, you may need to see a provider who specializes in the nervous system (neurologist). How is this treated? The goal of treatment is to prevent pain and stabilize the foot. First treatments may include:  A shoe insert to support and cushion the foot (orthotic device).  Supportive shoes with high ankle support and a wide base.  A brace to support a weak or unstable ankle.  Physical therapy to strengthen weak muscles and loosen tight muscles. If these treatments do not help, you may need surgery. This is more likely when high arches are caused by a neurological condition that is getting worse. Follow these instructions at home: General instructions  Take over-the-counter and prescription medicines only as told by your health care provider.  Use a shoe insert or supportive shoes as told by your health care provider.  Return to your normal activities as told by your health care provider. Ask your health care provider what activities are safe for you.  Keep all follow-up visits as told by your health care provider. This is important. If you have a brace:  Wear the brace as told by your health care provider. Remove it only as told by your health care provider.  Loosen the brace if  your toes tingle, become numb, or turn cold and blue.  Keep the brace clean.  Do not let the brace get wet if it is not waterproof.  Ask your health care provider when it is safe to drive if you have a brace on your foot. Contact a health care provider if:  You continue to have pain when walking or standing.  Your foot or ankle feels weak and unstable.  You sprain your ankle. Summary  A high arch foot is a condition in which the arch of the foot does not touch the ground when you are standing or walking.  A high arch foot can be painful and unstable.  This condition is diagnosed with a physical exam and other tests.  Treatment may include shoe inserts,  special shoes, bracing, physical therapy, or surgery. This information is not intended to replace advice given to you by your health care provider. Make sure you discuss any questions you have with your health care provider. Document Revised: 09/29/2018 Document Reviewed: 08/11/2017 Elsevier Patient Education  2020 [employer].

## 2020-01-29 DIAGNOSIS — Z6822 Body mass index (BMI) 22.0-22.9, adult: Secondary | ICD-10-CM | POA: Diagnosis not present

## 2020-01-29 DIAGNOSIS — Z1272 Encounter for screening for malignant neoplasm of vagina: Secondary | ICD-10-CM | POA: Diagnosis not present

## 2020-01-29 DIAGNOSIS — Z01419 Encounter for gynecological examination (general) (routine) without abnormal findings: Secondary | ICD-10-CM | POA: Diagnosis not present

## 2020-05-22 ENCOUNTER — Encounter: Payer: Self-pay | Admitting: Emergency Medicine

## 2020-05-22 ENCOUNTER — Ambulatory Visit
Admission: EM | Admit: 2020-05-22 | Discharge: 2020-05-22 | Disposition: A | Discharge status: Home / Self Care | Payer: Medicaid Other | Attending: Emergency Medicine | Admitting: Emergency Medicine

## 2020-05-22 DIAGNOSIS — J069 Acute upper respiratory infection, unspecified: Secondary | ICD-10-CM | POA: Diagnosis not present

## 2020-05-22 MED ORDER — DOXYCYCLINE HYCLATE 100 MG PO CAPS: 100.0000 mg | ORAL_CAPSULE | Freq: Two times a day (BID) | ORAL | 0 refills | Status: AC

## 2020-05-22 MED ORDER — FLUTICASONE PROPIONATE 50 MCG/ACT NA SUSP: 1.0000 | Freq: Every day | NASAL | 0 refills | Status: DC

## 2020-05-22 MED ORDER — BENZONATATE 200 MG PO CAPS: 200.0000 mg | ORAL_CAPSULE | Freq: Three times a day (TID) | ORAL | 0 refills | Status: AC | PRN

## 2020-05-22 NOTE — Discharge Instructions (Addendum)
Covid test pending, monitor my chart for results Continue to rest and drink plenty of fluids, Tylenol and ibuprofen as needed, Tessalon for cough Flonase nasal spray 1 to 2 spray in each nostril daily Daily cetirizine to help with congestion and drainage They fill prescription for doxycycline to cover sinus infection on Friday/Saturday if still not having any improvement with the above Follow-up if not improving or worsening

## 2020-05-22 NOTE — ED Provider Notes (Signed)
[facility]    CSN: [id number] Arrival date & time: 05/22/20  1325      History   Chief Complaint Chief Complaint  Patient presents with   Cough    HPI [name] is a 39 y.o. female presenting today for evaluation of URI symptoms.  Patient reports that over the the past 5 days she has had cough and congestion.  Of recently her congestion has worsened.  Using Zar[employer]'s over-the-counter.  She denies any chest pain or shortness of breath.  Denies fevers chills or body aches.  Daughter with similar symptoms which have resolved.  HPI  Past Medical History:  Diagnosis Date   Allergy    Anxiety    Depression    Frequent headaches    GERD (gastroesophageal reflux disease)    Headache(784.0)    otc med prn   History of tobacco abuse    Hx of cervical biopsy    Hx of chlamydia infection    Hx of gonorrhea    Hx of left breast biopsy    Hx of varicella    Migraines    Pneumothorax on left    Sleep apnea     Patient Active Problem List   Diagnosis Date Noted   Left leg paresthesias 07/11/2019   Pronation of both feet 07/11/2019   Pelvic pain 01/20/2018   Abdominal pain 09/16/2017   Encounter for health maintenance examination with abnormal findings 08/30/2017   Right lower quadrant abdominal pain 08/30/2017   LSIL (low grade squamous intraepithelial lesion) on Pap smear 07/27/2011   Pneumothorax on left    Hx of left breast biopsy    Hx of cervical biopsy    History of tobacco abuse     Past Surgical History:  Procedure Laterality Date   BILATERAL SALPINGECTOMY Bilateral 01/20/2018   Procedure: BILATERAL SALPINGECTOMY;  Surgeon: Levi AlandAnderson, Mark E, MD;  Location: [facility];  Service: Gynecology;  Laterality: Bilateral;   biopsy of cervix     BREAST BIOPSY  2005   cyst removed L breast   COLPOSCOPY VULVA W/ BIOPSY     Normal results   insertion of left chest tube  01/20/11   Dr Zenaida NieceVan Maudie Flakesrigt   NOVASURE ABLATION N/A  04/06/2013   Procedure: Cammy CopaNOVASURE ABLATION;  Surgeon: Levi AlandMark E Anderson, MD;  Location: [facility];  Service: Gynecology;  Laterality: N/A;   PLEURAL SCARIFICATION  2012 & 2013   TUBAL LIGATION  07/05/2012   Procedure: POST PARTUM TUBAL LIGATION;  Surgeon: Mickel Baasichard D Kaplan, MD;  Location: [facility];  Service: Gynecology;  Laterality: Bilateral;   VAGINAL HYSTERECTOMY Bilateral 01/20/2018   Procedure: HYSTERECTOMY VAGINAL;  Surgeon: Levi AlandAnderson, Mark E, MD;  Location: [facility];  Service: Gynecology;  Laterality: Bilateral;   VIDEO ASSISTED THORACOSCOPY  08/24/2011   Procedure: VIDEO ASSISTED THORACOSCOPY;  Surgeon: Norton Blizzard Patrick Burney, MD;  [facility];  Service: Thoracic;  Laterality: Left;  Left Video assisted thorascopic surgery , RESECTION OF APICIAL BLEB WITH PLEURODESIS    OB History    Gravida  2   Para  2   Term  2   Preterm      AB      Living  2     SAB      TAB      Ectopic      Multiple      Live Births  2            Home Medications    Prior  to Admission medications   Medication Sig Start Date End Date Taking? Authorizing Provider  benzonatate (TESSALON) 200 MG capsule Take 1 capsule (200 mg total) by mouth 3 (three) times daily as needed for up to 7 days for cough. 05/22/20 05/29/20  Apryl Brymer C, PA-C  doxycycline (VIBRAMYCIN) 100 MG capsule Take 1 capsule (100 mg total) by mouth 2 (two) times daily for 7 days. 05/22/20 05/29/20  Angala Hilgers C, PA-C  fluticasone (FLONASE) 50 MCG/ACT nasal spray Place 1-2 sprays into both nostrils daily. 05/22/20   Juanjose Mojica C, PA-C  gabapentin (NEURONTIN) 300 MG capsule Take 1 capsule (300 mg total) by mouth at bedtime. For nerve pain 07/11/19 05/22/20  Rodolph Bong, MD  iron polysaccharides (NIFEREX) 150 MG capsule Take 1 capsule (150 mg total) by mouth daily. For one month then stop. 08/26/11 09/16/11  Ardelle Balls, PA-C    Family History Family History  Problem Relation Age of Onset   Alcohol abuse Mother     Depression Mother    Drug abuse Mother    Depression Brother    Depression Brother    Cancer Paternal Grandmother    Mental illness Paternal Grandmother    Other Neg Hx     Social History Social History   Tobacco Use   Smoking status: Former Smoker    Packs/day: 0.50    Types: Cigarettes    Quit date: 11/21/2010    Years since quitting: 9.5   Smokeless tobacco: Never Used  Vaping Use   Vaping Use: Never used  Substance Use Topics   Alcohol use: Yes    Comment: socially   Drug use: No     Allergies   Penicillins   Review of Systems Review of Systems  Constitutional: Negative for activity change, appetite change, chills, fatigue and fever.  HENT: Positive for congestion and rhinorrhea. Negative for ear pain, sinus pressure, sore throat and trouble swallowing.   Eyes: Negative for discharge and redness.  Respiratory: Positive for cough. Negative for chest tightness and shortness of breath.   Cardiovascular: Negative for chest pain.  Gastrointestinal: Negative for abdominal pain, diarrhea, nausea and vomiting.  Musculoskeletal: Negative for myalgias.  Skin: Negative for rash.  Neurological: Negative for dizziness, light-headedness and headaches.     Physical Exam Triage Vital Signs ED Triage Vitals  Enc Vitals Group     BP 05/22/20 1350 109/67     Pulse Rate 05/22/20 1350 72     Resp 05/22/20 1350 16     Temp 05/22/20 1350 98.4 F (36.9 C)     Temp Source 05/22/20 1350 Oral     SpO2 05/22/20 1350 99 %     Weight --      Height --      Head Circumference --      Peak Flow --      Pain Score 05/22/20 1349 0     Pain Loc --      Pain Edu? --      Excl. in GC? --    No data found.  Updated Vital Signs BP 109/67 (BP Location: Left Arm)    Pulse 72    Temp 98.4 F (36.9 C) (Oral)    Resp 16    SpO2 99%   Visual Acuity Right Eye Distance:   Left Eye Distance:   Bilateral Distance:    Right Eye Near:   Left Eye Near:    Bilateral Near:      Physical Exam Vitals  and nursing note reviewed.  Constitutional:      Appearance: She is well-developed.     Comments: No acute distress  HENT:     Head: Normocephalic and atraumatic.     Ears:     Comments: Bilateral ears without tenderness to palpation of external auricle, tragus and mastoid, EAC's without erythema or swelling, TM's with good bony landmarks and cone of light. Non erythematous.     Nose: Nose normal.     Mouth/Throat:     Comments: Oral mucosa pink and moist, no tonsillar enlargement or exudate. Posterior pharynx patent and nonerythematous, no uvula deviation or swelling. Normal phonation. Eyes:     Conjunctiva/sclera: Conjunctivae normal.  Cardiovascular:     Rate and Rhythm: Normal rate.  Pulmonary:     Effort: Pulmonary effort is normal. No respiratory distress.     Comments: Breathing comfortably at rest, CTABL, no wheezing, rales or other adventitious sounds auscultated Abdominal:     General: There is no distension.  Musculoskeletal:        General: Normal range of motion.     Cervical back: Neck supple.  Skin:    General: Skin is warm and dry.  Neurological:     Mental Status: She is alert and oriented to person, place, and time.      UC Treatments / Results  Labs (all labs ordered are listed, but only abnormal results are displayed) Labs Reviewed  NOVEL CORONAVIRUS, NAA    EKG   Radiology No results found.  Procedures Procedures (including critical care time)  Medications Ordered in UC Medications - No data to display  Initial Impression / Assessment and Plan / UC Course  I have reviewed the triage vital signs and the nursing notes.  Pertinent labs & imaging results that were available during my care of the patient were reviewed by me and considered in my medical decision making (see chart for details).     URI symptoms x5 days, Covid test pending.  Suspect likely viral etiology and recommending continued symptomatic and  supportive care.  Provided printed prescription for doxycycline to fill in an additional 3 to 4 days if still not having any improvement with recommendations today and an additional time.  Exam reassuring today.  Discussed strict return precautions. Patient verbalized understanding and is agreeable with plan.  Final Clinical Impressions(s) / UC Diagnoses   Final diagnoses:  Viral URI with cough     Discharge Instructions     Covid test pending, monitor my chart for results Continue to rest and drink plenty of fluids, Tylenol and ibuprofen as needed, Tessalon for cough Flonase nasal spray 1 to 2 spray in each nostril daily Daily cetirizine to help with congestion and drainage They fill prescription for doxycycline to cover sinus infection on Friday/Saturday if still not having any improvement with the above Follow-up if not improving or worsening    ED Prescriptions    Medication Sig Dispense Auth. Provider   doxycycline (VIBRAMYCIN) 100 MG capsule Take 1 capsule (100 mg total) by mouth 2 (two) times daily for 7 days. 14 capsule Hadlee Burback C, PA-C   benzonatate (TESSALON) 200 MG capsule Take 1 capsule (200 mg total) by mouth 3 (three) times daily as needed for up to 7 days for cough. 28 capsule Terecia Plaut C, PA-C   fluticasone (FLONASE) 50 MCG/ACT nasal spray Place 1-2 sprays into both nostrils daily. 16 g Damean Poffenberger, [place] C, PA-C     PDMP not reviewed this encounter.  Dorothia Passmore, [employer] C, PA-C 05/22/20 1520

## 2020-05-22 NOTE — ED Triage Notes (Signed)
Cough and runny nose x 1 week

## 2020-05-24 LAB — SARS-COV-2, NAA 2 DAY TAT

## 2020-05-24 LAB — NOVEL CORONAVIRUS, NAA: SARS-CoV-2, NAA: NOT DETECTED

## 2020-06-12 ENCOUNTER — Ambulatory Visit (INDEPENDENT_AMBULATORY_CARE_PROVIDER_SITE_OTHER): Payer: Medicaid Other | Admitting: Family

## 2020-06-12 ENCOUNTER — Encounter: Payer: Self-pay | Admitting: Family

## 2020-06-12 ENCOUNTER — Other Ambulatory Visit: Payer: Self-pay

## 2020-06-12 VITALS — BP 110/62 | HR 74 | Temp 96.4°F | Ht 69.0 in | Wt 156.0 lb

## 2020-06-12 DIAGNOSIS — J029 Acute pharyngitis, unspecified: Secondary | ICD-10-CM | POA: Diagnosis not present

## 2020-06-12 LAB — POCT RAPID STREP A (OFFICE): Rapid Strep A Screen: NEGATIVE

## 2020-06-12 LAB — POCT MONO (EPSTEIN BARR VIRUS): Mono, POC: NEGATIVE

## 2020-06-12 MED ORDER — AZITHROMYCIN 250 MG PO TABS: 250.0000 mg | ORAL_TABLET | Freq: Every day | ORAL | 0 refills | Status: DC

## 2020-06-12 NOTE — Patient Instructions (Signed)
Peritonsillar Abscess A peritonsillar abscess is an infected area in your throat that is filled with pus. It forms behind your tonsils. This may be treated by:  Draining the pus. Your doctor may do this with a syringe and a needle (needle aspiration) or by making a cut in the abscess.  Using antibiotic medicine. Follow these instructions at home: Medicines  Take over-the-counter and prescription medicines only as told by your doctor.  If you were prescribed an antibiotic, take it as told by your doctor. Do not stop taking the antibiotic even if you start to feel better. Eating and drinking   Drink enough fluid to keep your pee (urine) pale yellow.  While your throat is sore, try one of these: ? Only drinking liquids. ? Eating only soft foods, such as yogurt and ice cream. General instructions  Rest as much as you can. Get plenty of sleep.  Return to your normal activities as told by your doctor. Ask your doctor what activities are safe for you.  If your abscess was drained, gargle with a salt-water mixture 3-4 times a day or as needed. ? To make a salt-water mixture, completely dissolve -1 tsp of salt in 1 cup of warm water. ? Do not swallow this mixture.  Do not use any products that have nicotine or tobacco in them. These include cigarettes and e-cigarettes. If you need help quitting, ask your doctor.  Keep all follow-up visits as told by your doctor. This is important. Contact a doctor if you have:  More pain, swelling, redness, or pus in your throat.  A headache.  Low energy (lethargy).  A general feeling of illness (malaise).  A fever.  Dizziness.  Trouble swallowing.  Trouble eating.  Signs of body fluid loss (dehydration), such as: ? Feeling light-headed when you are standing. ? Peeing (urinating) less than usual. ? A fast heart rate. ? Dry mouth. Get help right away if you:  Have trouble talking.  Have trouble breathing.  Breathing is easier when  you lean forward.  Cough up blood.  Throw up (vomit) blood.  Have very bad throat pain and it does not get better with medicine. Summary  A peritonsillar abscess is an infected area in your throat that is filled with pus.  You may be treated by having the abscess drained and by taking antibiotic medicine.  Contact a doctor if you have trouble swallowing or eating.  Get help right away if you cough up blood or see blood when you throw up (vomit). This information is not intended to replace advice given to you by your health care provider. Make sure you discuss any questions you have with your health care provider. Document Revised: 05/21/2017 Document Reviewed: 04/20/2017 Elsevier Patient Education  2020 Elsevier Inc.  

## 2020-06-12 NOTE — Progress Notes (Signed)
Acute Office Visit  Subjective:    Patient ID: [name], female    DOB: 22-Mar-1981, 39 y.o.   MRN: [id number]  Chief Complaint  Patient presents with   Acute Visit    Pt c/o swollen neck, x1day, pain to touch and tenderness when n=moving head and when swallowing, pain level 5 currently, pt denies injury, or any cause for allergic reaction    HPI Patient is in today with c/o pain to the right side of her neck that is worse with movement and swallowing. Reports having an URI about 2 weeks ago. Does not have any current cough, sneezing or congestion. Symptoms have been ongoing x 2 days. Has not been taking any meds.   Past Medical History:  Diagnosis Date   Allergy    Anxiety    Depression    Frequent headaches    GERD (gastroesophageal reflux disease)    Headache(784.0)    otc med prn   History of tobacco abuse    Hx of cervical biopsy    Hx of chlamydia infection    Hx of gonorrhea    Hx of left breast biopsy    Hx of varicella    Migraines    Pneumothorax on left    Sleep apnea     Past Surgical History:  Procedure Laterality Date   BILATERAL SALPINGECTOMY Bilateral 01/20/2018   Procedure: BILATERAL SALPINGECTOMY;  Surgeon: Levi Aland, MD;  Location: [facility];  Service: Gynecology;  Laterality: Bilateral;   biopsy of cervix     BREAST BIOPSY  2005   cyst removed L breast   COLPOSCOPY VULVA W/ BIOPSY     Normal results   insertion of left chest tube  01/20/11   Dr Zenaida Niece Maudie Flakes   NOVASURE ABLATION N/A 04/06/2013   Procedure: Cammy Copa ABLATION;  Surgeon: Levi Aland, MD;  Location: [facility];  Service: Gynecology;  Laterality: N/A;   PLEURAL SCARIFICATION  2012 & 2013   TUBAL LIGATION  07/05/2012   Procedure: POST PARTUM TUBAL LIGATION;  Surgeon: Mickel Baas, MD;  Location: [facility];  Service: Gynecology;  Laterality: Bilateral;   VAGINAL HYSTERECTOMY Bilateral 01/20/2018   Procedure: HYSTERECTOMY VAGINAL;  Surgeon: Levi Aland,  MD;  Location: [facility];  Service: Gynecology;  Laterality: Bilateral;   VIDEO ASSISTED THORACOSCOPY  08/24/2011   Procedure: VIDEO ASSISTED THORACOSCOPY;  Surgeon: Norton Blizzard, MD;  Location: [facility];  Service: Thoracic;  Laterality: Left;  Left Video assisted thorascopic surgery , RESECTION OF APICIAL BLEB WITH PLEURODESIS    Family History  Problem Relation Age of Onset   Alcohol abuse Mother    Depression Mother    Drug abuse Mother    Depression Brother    Depression Brother    Cancer Paternal Grandmother    Mental illness Paternal Grandmother    Other Neg Hx     Social History   Socioeconomic History   Marital status: Single    Spouse name: Not on file   Number of children: Not on file   Years of education: Not on file   Highest education level: Not on file  Occupational History    Employer: [employer]  Tobacco Use   Smoking status: Former Smoker    Packs/day: 0.50    Types: Cigarettes    Quit date: 11/21/2010    Years since quitting: 9.5   Smokeless tobacco: Never Used  Vaping Use   Vaping Use: Never used  Substance and  Sexual Activity   Alcohol use: Yes    Comment: socially   Drug use: No   Sexual activity: Not Currently    Birth control/protection: Surgical    Comment: tubal  Other Topics Concern   Not on file  Social History Narrative   Not on file   Social Determinants of Health   Financial Resource Strain: Not on file  Food Insecurity: Not on file  Transportation Needs: Not on file  Physical Activity: Not on file  Stress: Not on file  Social Connections: Not on file  Intimate Partner Violence: Not on file    Outpatient Medications Prior to Visit  Medication Sig Dispense Refill   fluticasone (FLONASE) 50 MCG/ACT nasal spray Place 1-2 sprays into both nostrils daily. (Patient not taking: Reported on 06/12/2020) 16 g 0   No facility-administered medications prior to visit.    Allergies  Allergen Reactions    Penicillins Rash    Family h/o allergy. Can tolerate amoxicillin ok. Has patient had a PCN reaction causing immediate rash, facial/tongue/throat swelling, SOB or lightheadedness with hypotension: Yes Has patient had a PCN reaction causing severe rash involving mucus membranes or skin necrosis: Unknown: Has patient had a PCN reaction that required hospitalization: No Has patient had a PCN reaction occurring within the last 10 years: No If all of the above answers are "NO", then may proceed with Cephalosporin use.     Review of Systems  Constitutional: Negative.  Negative for fatigue and fever.  HENT: Positive for sore throat.        Pain in neck-right  Respiratory: Negative.   Cardiovascular: Negative.   Endocrine: Negative.   Musculoskeletal: Negative.   Skin: Negative.   Allergic/Immunologic: Negative.   Psychiatric/Behavioral: Negative.        Objective:    Physical Exam Constitutional:      Appearance: Normal appearance.  HENT:     Right Ear: Tympanic membrane and ear canal normal.     Left Ear: Tympanic membrane and ear canal normal.     Nose: No congestion or rhinorrhea.     Mouth/Throat:     Mouth: Mucous membranes are moist.     Pharynx: Posterior oropharyngeal erythema present. No oropharyngeal exudate.  Neck:     Comments: Right side  Cardiovascular:     Rate and Rhythm: Normal rate and regular rhythm.     Pulses: Normal pulses.     Heart sounds: Normal heart sounds.  Pulmonary:     Effort: Pulmonary effort is normal.     Breath sounds: Normal breath sounds.  Musculoskeletal:        General: Normal range of motion.     Cervical back: Normal range of motion and neck supple. Tenderness present.  Lymphadenopathy:     Cervical: Cervical adenopathy present.  Skin:    General: Skin is warm and dry.  Neurological:     General: No focal deficit present.     Mental Status: She is alert and oriented to person, place, and time.  Psychiatric:        Mood and  Affect: Mood normal.        Behavior: Behavior normal.     BP 110/62 (BP Location: Left Arm, Patient Position: Sitting, Cuff Size: Normal)    Pulse 74    Temp (!) 96.4 F (35.8 C) (Temporal)    Ht 5\' 9"  (1.753 m)    Wt 156 lb (70.8 kg)    LMP 03/05/2013    SpO2 100%  BMI 23.04 kg/m  Wt Readings from Last 3 Encounters:  06/12/20 156 lb (70.8 kg)  07/11/19 150 lb (68 kg)  07/11/19 149 lb 3.2 oz (67.7 kg)    Health Maintenance Due  Topic Date Due   Hepatitis C Screening  Never done   TETANUS/TDAP  Never done   PAP SMEAR-Modifier  06/10/2019   INFLUENZA VACCINE  Never done    There are no preventive care reminders to display for this patient.   Lab Results  Component Value Date   TSH 1.37 07/11/2019   Lab Results  Component Value Date   WBC 4.6 07/11/2019   HGB 12.1 07/11/2019   HCT 36.0 07/11/2019   MCV 98.6 07/11/2019   PLT 253.0 07/11/2019   Lab Results  Component Value Date   NA 139 12/17/2017   K 3.4 (L) 12/17/2017   CO2 24 12/17/2017   GLUCOSE 97 12/17/2017   BUN 11 12/17/2017   CREATININE 0.87 12/17/2017   BILITOT 0.7 12/17/2017   ALKPHOS 46 12/17/2017   AST 23 12/17/2017   ALT 19 12/17/2017   PROT 7.4 12/17/2017   ALBUMIN 3.9 12/17/2017   CALCIUM 9.6 12/17/2017   ANIONGAP 8 12/17/2017   GFR 106.81 08/30/2017   Lab Results  Component Value Date   CHOL 165 08/30/2017   Lab Results  Component Value Date   HDL 69.40 08/30/2017   Lab Results  Component Value Date   LDLCALC 87 08/30/2017   Lab Results  Component Value Date   TRIG 43.0 08/30/2017   Lab Results  Component Value Date   CHOLHDL 2 08/30/2017   No results found for: HGBA1C     Assessment & Plan:   Problem List Items Addressed This Visit   None   Visit Diagnoses    Sore throat    -  Primary   Relevant Orders   POCT rapid strep A (Completed)   POCT Mono (Epstein Barr Virus) (Completed)   CBC w/Diff       Meds ordered this encounter  Medications   azithromycin  (ZITHROMAX Z-PAK) 250 MG tablet    Sig: Take 1 tablet (250 mg total) by mouth daily. zpak as directed    Dispense:  6 tablet    Refill:  0   Will follow-up as needed. Call if symptoms worsen or persist.   Eulis Foster, FNP

## 2020-06-13 LAB — CBC WITH DIFFERENTIAL/PLATELET
Basophils Absolute: 0 10*3/uL (ref 0.0–0.1)
Basophils Relative: 0.7 % (ref 0.0–3.0)
Eosinophils Absolute: 0.1 10*3/uL (ref 0.0–0.7)
Eosinophils Relative: 2.2 % (ref 0.0–5.0)
HCT: 37.2 % (ref 36.0–46.0)
Hemoglobin: 12.4 g/dL (ref 12.0–15.0)
Lymphocytes Relative: 22.4 % (ref 12.0–46.0)
Lymphs Abs: 1.3 10*3/uL (ref 0.7–4.0)
MCHC: 33.3 g/dL (ref 30.0–36.0)
MCV: 98.9 fl (ref 78.0–100.0)
Monocytes Absolute: 0.6 10*3/uL (ref 0.1–1.0)
Monocytes Relative: 10 % (ref 3.0–12.0)
Neutro Abs: 3.8 10*3/uL (ref 1.4–7.7)
Neutrophils Relative %: 64.7 % (ref 43.0–77.0)
Platelets: 268 10*3/uL (ref 150.0–400.0)
RBC: 3.76 Mil/uL — ABNORMAL LOW (ref 3.87–5.11)
RDW: 12.9 % (ref 11.5–15.5)
WBC: 5.9 10*3/uL (ref 4.0–10.5)

## 2020-11-22 ENCOUNTER — Other Ambulatory Visit: Payer: Self-pay

## 2020-11-22 ENCOUNTER — Ambulatory Visit
Admission: EM | Admit: 2020-11-22 | Discharge: 2020-11-22 | Disposition: A | Discharge status: Home / Self Care | Payer: 59 | Attending: Student | Admitting: Student

## 2020-11-22 DIAGNOSIS — B3731 Acute candidiasis of vulva and vagina: Secondary | ICD-10-CM

## 2020-11-22 DIAGNOSIS — N760 Acute vaginitis: Secondary | ICD-10-CM | POA: Diagnosis present

## 2020-11-22 DIAGNOSIS — B9689 Other specified bacterial agents as the cause of diseases classified elsewhere: Secondary | ICD-10-CM | POA: Insufficient documentation

## 2020-11-22 DIAGNOSIS — B373 Candidiasis of vulva and vagina: Secondary | ICD-10-CM | POA: Insufficient documentation

## 2020-11-22 LAB — POCT URINALYSIS DIP (MANUAL ENTRY)
Bilirubin, UA: NEGATIVE
Glucose, UA: NEGATIVE mg/dL
Ketones, POC UA: NEGATIVE mg/dL
Nitrite, UA: NEGATIVE
Protein Ur, POC: NEGATIVE mg/dL
Spec Grav, UA: 1.015 (ref 1.010–1.025)
Urobilinogen, UA: 0.2 E.U./dL
pH, UA: 8.5 — AB (ref 5.0–8.0)

## 2020-11-22 MED ORDER — METRONIDAZOLE 500 MG PO TABS: 500.0000 mg | ORAL_TABLET | Freq: Two times a day (BID) | ORAL | 0 refills | Status: DC

## 2020-11-22 NOTE — Discharge Instructions (Addendum)
-  For the BV component of your symptoms, start the new antibiotic-Flagyl (metronidazole), 2 pills daily for 7 days.  You can take this with food if you have a sensitive stomach.  Make sure to avoid alcohol while on this medication as this can cause severe nausea and vomiting. -Finish your course of Diflucan for the yeast component of your symptoms.  -The swab we sent will come back in about 48 hours, we will call you if this is positive and if we need to change anything.  -Seek additional medical attention if you develop new symptoms like new/worsening abdominal pain, back pain, fever/chills, etc

## 2020-11-22 NOTE — ED Provider Notes (Signed)
[facility]    CSN: [id number] Arrival date & time: 11/22/20  0932      History   Chief Complaint Chief Complaint  Patient presents with  . Vaginal Itching    HPI [name] is a 40 y.o. female presenting with vaginal itching and discomfort x 3days following going swimming. Medical history chlamydia, gonorrhea.  Self-reported history of recurrent BV and yeast.  States her GYN sent some Diflucan following a telephone visit 1 day ago, patient took 1 day of this but states that her symptoms have persisted.  Today continues to endorse dysuria, external vaginal itching, uniform erythema of the labia.  Denies abdominal pain, back pain, urinary frequency, fever/chills, vaginal discharge, vaginal lesions, vaginal rashes.  States she has been with the same partner for years, denies established.  Hysterectomy for contraception.  HPI  Past Medical History:  Diagnosis Date  . Allergy   . Anxiety   . Depression   . Frequent headaches   . GERD (gastroesophageal reflux disease)   . Headache(784.0)    otc med prn  . History of tobacco abuse   . Hx of cervical biopsy   . Hx of chlamydia infection   . Hx of gonorrhea   . Hx of left breast biopsy   . Hx of varicella   . Migraines   . Pneumothorax on left   . Sleep apnea     Patient Active Problem List   Diagnosis Date Noted  . Left leg paresthesias 07/11/2019  . Pronation of both feet 07/11/2019  . Pelvic pain 01/20/2018  . Abdominal pain 09/16/2017  . Encounter for health maintenance examination with abnormal findings 08/30/2017  . Right lower quadrant abdominal pain 08/30/2017  . LSIL (low grade squamous intraepithelial lesion) on Pap smear 07/27/2011  . Pneumothorax on left   . Hx of left breast biopsy   . Hx of cervical biopsy   . History of tobacco abuse     Past Surgical History:  Procedure Laterality Date  . BILATERAL SALPINGECTOMY Bilateral 01/20/2018   Procedure: BILATERAL SALPINGECTOMY;  Surgeon:  Levi Aland, MD;  Location: [facility];  Service: Gynecology;  Laterality: Bilateral;  . biopsy of cervix    . BREAST BIOPSY  2005   cyst removed L breast  . COLPOSCOPY VULVA W/ BIOPSY     Normal results  . insertion of left chest tube  01/20/11   Dr Donata Clay  . NOVASURE ABLATION N/A 04/06/2013   Procedure: NOVASURE ABLATION;  Surgeon: Levi Aland, MD;  Location: [facility];  Service: Gynecology;  Laterality: N/A;  . PLEURAL SCARIFICATION  2012 & 2013  . TUBAL LIGATION  07/05/2012   Procedure: POST PARTUM TUBAL LIGATION;  Surgeon: Mickel Baas, MD;  Location: [facility];  Service: Gynecology;  Laterality: Bilateral;  . VAGINAL HYSTERECTOMY Bilateral 01/20/2018   Procedure: HYSTERECTOMY VAGINAL;  Surgeon: Levi Aland, MD;  Location: [facility];  Service: Gynecology;  Laterality: Bilateral;  . VIDEO ASSISTED THORACOSCOPY  08/24/2011   Procedure: VIDEO ASSISTED THORACOSCOPY;  Surgeon: Norton Blizzard, MD;  Location: [facility];  Service: Thoracic;  Laterality: Left;  Left Video assisted thorascopic surgery , RESECTION OF APICIAL BLEB WITH PLEURODESIS    OB History    Gravida  2   Para  2   Term  2   Preterm      AB      Living  2     SAB      IAB  Ectopic      Multiple      Live Births  2            Home Medications    Prior to Admission medications   Medication Sig Start Date End Date Taking? Authorizing Provider  metroNIDAZOLE (FLAGYL) 500 MG tablet Take 1 tablet (500 mg total) by mouth 2 (two) times daily. 11/22/20  Yes Rhys Martini, PA-C  azithromycin (ZITHROMAX Z-PAK) 250 MG tablet Take 1 tablet (250 mg total) by mouth daily. zpak as directed 06/12/20   Eulis Foster, FNP  fluticasone (FLONASE) 50 MCG/ACT nasal spray Place 1-2 sprays into both nostrils daily. Patient not taking: Reported on 06/12/2020 05/22/20   Wieters, Hallie C, PA-C  gabapentin (NEURONTIN) 300 MG capsule Take 1 capsule (300 mg total) by mouth at bedtime. For nerve pain 07/11/19 05/22/20   Rodolph Bong, MD  iron polysaccharides (NIFEREX) 150 MG capsule Take 1 capsule (150 mg total) by mouth daily. For one month then stop. 08/26/11 09/16/11  Ardelle Balls, PA-C    Family History Family History  Problem Relation Age of Onset  . Alcohol abuse Mother   . Depression Mother   . Drug abuse Mother   . Depression Brother   . Depression Brother   . Cancer Paternal Grandmother   . Mental illness Paternal Grandmother   . Other Neg Hx     Social History Social History   Tobacco Use  . Smoking status: Former Smoker    Packs/day: 0.50    Types: Cigarettes    Quit date: 11/21/2010    Years since quitting: 10.0  . Smokeless tobacco: Never Used  Vaping Use  . Vaping Use: Never used  Substance Use Topics  . Alcohol use: Yes    Comment: socially  . Drug use: No     Allergies   Penicillins   Review of Systems Review of Systems  Constitutional: Negative for appetite change, chills, diaphoresis and fever.  Respiratory: Negative for shortness of breath.   Cardiovascular: Negative for chest pain.  Gastrointestinal: Negative for abdominal pain, blood in stool, constipation, diarrhea, nausea and vomiting.  Genitourinary: Positive for dysuria. Negative for decreased urine volume, difficulty urinating, flank pain, frequency, genital sores, hematuria, menstrual problem, pelvic pain, urgency, vaginal bleeding, vaginal discharge and vaginal pain.  Musculoskeletal: Negative for back pain.  Neurological: Negative for dizziness, weakness and light-headedness.  All other systems reviewed and are negative.    Physical Exam Triage Vital Signs ED Triage Vitals  Enc Vitals Group     BP 11/22/20 1152 124/81     Pulse Rate 11/22/20 1152 (!) 107     Resp 11/22/20 1152 16     Temp 11/22/20 1152 98 F (36.7 C)     Temp Source 11/22/20 1152 Oral     SpO2 11/22/20 1152 98 %     Weight --      Height --      Head Circumference --      Peak Flow --      Pain Score 11/22/20 1155  0     Pain Loc --      Pain Edu? --      Excl. in GC? --    No data found.  Updated Vital Signs BP 124/81 (BP Location: Right Arm)   Pulse (!) 107   Temp 98 F (36.7 C) (Oral)   Resp 16   LMP 03/05/2013   SpO2 98%   Visual Acuity Right Eye  Distance:   Left Eye Distance:   Bilateral Distance:    Right Eye Near:   Left Eye Near:    Bilateral Near:     Physical Exam Vitals reviewed.  Constitutional:      General: She is not in acute distress.    Appearance: Normal appearance. She is not ill-appearing.  HENT:     Head: Normocephalic and atraumatic.     Mouth/Throat:     Mouth: Mucous membranes are moist.     Comments: Moist mucous membranes Eyes:     Extraocular Movements: Extraocular movements intact.     Pupils: Pupils are equal, round, and reactive to light.  Cardiovascular:     Rate and Rhythm: Normal rate and regular rhythm.     Heart sounds: Normal heart sounds.  Pulmonary:     Effort: Pulmonary effort is normal.     Breath sounds: Normal breath sounds. No wheezing, rhonchi or rales.  Abdominal:     General: Bowel sounds are normal. There is no distension.     Palpations: Abdomen is soft. There is no mass.     Tenderness: There is no abdominal tenderness. There is no right CVA tenderness, left CVA tenderness, guarding or rebound. Negative signs include Murphy's sign, Rovsing's sign and McBurney's sign.  Genitourinary:    Comments: deferred Skin:    General: Skin is warm.     Capillary Refill: Capillary refill takes less than 2 seconds.     Comments: Good skin turgor  Neurological:     General: No focal deficit present.     Mental Status: She is alert and oriented to person, place, and time.  Psychiatric:        Mood and Affect: Mood normal.        Behavior: Behavior normal.      UC Treatments / Results  Labs (all labs ordered are listed, but only abnormal results are displayed) Labs Reviewed  POCT URINALYSIS DIP (MANUAL ENTRY) - Abnormal; Notable  for the following components:      Result Value   Blood, UA trace-intact (*)    pH, UA 8.5 (*)    Leukocytes, UA Moderate (2+) (*)    All other components within normal limits  URINE CULTURE  CERVICOVAGINAL ANCILLARY ONLY    EKG   Radiology No results found.  Procedures Procedures (including critical care time)  Medications Ordered in UC Medications - No data to display  Initial Impression / Assessment and Plan / UC Course  I have reviewed the triage vital signs and the nursing notes.  Pertinent labs & imaging results that were available during my care of the patient were reviewed by me and considered in my medical decision making (see chart for details).     This patient is a 40 year old female presenting with suspected BV and vaginal candidiasis.  Afebrile, borderline tachycardic.  No abdominal pain or CVAT.  Self-swab sent for BV, yeast, gonorrhea, chlamydia, trichomonas.  History of gonorrhea and chlamydia, self-reported history of BV and yeast.  She is currently being treated for yeast by her GYN via telephone visit. UA today with trace blood, moderate leuk.  S/p hysterectomy, states she could not be pregnant.   Flagyl sent.  Continue Diflucan as prescribed by GYN.  ED return precautions discussed.  Final Clinical Impressions(s) / UC Diagnoses   Final diagnoses:  Bacterial vaginosis  Vaginal candida     Discharge Instructions     -For the BV component of your symptoms, start the new  antibiotic-Flagyl (metronidazole), 2 pills daily for 7 days.  You can take this with food if you have a sensitive stomach.  Make sure to avoid alcohol while on this medication as this can cause severe nausea and vomiting. -Finish your course of Diflucan for the yeast component of your symptoms.  -The swab we sent will come back in about 48 hours, we will call you if this is positive and if we need to change anything.  -Seek additional medical attention if you develop new symptoms  like new/worsening abdominal pain, back pain, fever/chills, etc    ED Prescriptions    Medication Sig Dispense Auth. Provider   metroNIDAZOLE (FLAGYL) 500 MG tablet Take 1 tablet (500 mg total) by mouth 2 (two) times daily. 14 tablet Rhys MartiniGraham, Elliot Simoneaux E, PA-C     PDMP not reviewed this encounter.   Rhys MartiniGraham, Edythe Riches E, PA-C 11/22/20 1338

## 2020-11-22 NOTE — ED Triage Notes (Signed)
Pt present vaginal discomfort with itching and burning internal of the vaginal. Pt states symptom started on Tuesday.

## 2020-11-25 LAB — CERVICOVAGINAL ANCILLARY ONLY
Bacterial Vaginitis (gardnerella): POSITIVE — AB
Candida Glabrata: NEGATIVE
Candida Vaginitis: POSITIVE — AB
Chlamydia: NEGATIVE
Comment: NEGATIVE
Comment: NEGATIVE
Comment: NEGATIVE
Comment: NEGATIVE
Comment: NEGATIVE
Comment: NORMAL
Neisseria Gonorrhea: NEGATIVE
Trichomonas: NEGATIVE

## 2020-11-27 ENCOUNTER — Telehealth (HOSPITAL_COMMUNITY): Payer: Self-pay | Admitting: Emergency Medicine

## 2020-11-27 MED ORDER — FLUCONAZOLE 150 MG PO TABS: 150.0000 mg | ORAL_TABLET | Freq: Once | ORAL | 0 refills | Status: AC

## 2021-11-24 ENCOUNTER — Encounter: Payer: Self-pay | Admitting: Family Medicine

## 2021-11-24 ENCOUNTER — Ambulatory Visit (INDEPENDENT_AMBULATORY_CARE_PROVIDER_SITE_OTHER): Payer: 59 | Admitting: Family Medicine

## 2021-11-24 VITALS — BP 108/68 | HR 72 | Temp 97.1°F | Ht 69.0 in | Wt 172.2 lb

## 2021-11-24 DIAGNOSIS — Z23 Encounter for immunization: Secondary | ICD-10-CM | POA: Diagnosis not present

## 2021-11-24 DIAGNOSIS — Z0000 Encounter for general adult medical examination without abnormal findings: Secondary | ICD-10-CM

## 2021-11-24 DIAGNOSIS — Z131 Encounter for screening for diabetes mellitus: Secondary | ICD-10-CM | POA: Diagnosis not present

## 2021-11-24 NOTE — Progress Notes (Signed)
Established Patient Office Visit  Subjective   Patient ID: [name], female    DOB: 1981-04-30  Age: 41 y.o. MRN: [id number]  Chief Complaint  Patient presents with   Annual Exam    CPE, no concerns. Patient fasting.     HPI for health check today.  She is fasting.  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  Works from home and caring for her 39 year old and 35-year-old children.  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  She is doing well.  Has regular dental care.  Does not seem to have time for regular exercise.  Female care through GYN.    Review of Systems  Constitutional:  Negative for chills, diaphoresis, malaise/fatigue and weight loss.  HENT: Negative.    Eyes: Negative.  Negative for blurred vision and double vision.  Cardiovascular:  Negative for chest pain.  Gastrointestinal:  Negative for abdominal pain.  Genitourinary: Negative.   Musculoskeletal:  Negative for falls and myalgias.  Neurological:  Negative for speech change, loss of consciousness and weakness.  Psychiatric/Behavioral: Negative.       Objective:     BP 108/68 (BP Location: Right Arm, Patient Position: Sitting, Cuff Size: Large)   Pulse 72   Temp (!) 97.1 F (36.2 C) (Temporal)   Ht 5\' 9"  (1.753 m)   Wt 172 lb 3.2 oz (78.1 kg)   LMP 03/05/2013   SpO2 100%   BMI 25.43 kg/m    Physical Exam Constitutional:      General: She is not in acute distress.    Appearance: Normal appearance. She is not ill-appearing, toxic-appearing or diaphoretic.  HENT:     Head: Normocephalic and atraumatic.     Right Ear: External ear normal.     Left Ear: External ear normal.     Mouth/Throat:     Mouth: Mucous membranes are moist.     Pharynx: Oropharynx is clear. No oropharyngeal exudate or posterior oropharyngeal erythema.  Eyes:     General: No scleral icterus.       Right eye: No discharge.        Left eye: No discharge.     Extraocular  Movements: Extraocular movements intact.     Conjunctiva/sclera: Conjunctivae normal.     Pupils: Pupils are equal, round, and reactive to light.  Cardiovascular:     Rate and Rhythm: Normal rate and regular rhythm.  Pulmonary:     Effort: Pulmonary effort is normal. No respiratory distress.     Breath sounds: Normal breath sounds.  Abdominal:     General: Bowel sounds are normal.  Musculoskeletal:     Cervical back: No rigidity or tenderness.  Skin:    General: Skin is warm and dry.  Neurological:     Mental Status: She is alert and oriented to person, place, and time.  Psychiatric:        Mood and Affect: Mood normal.        Behavior: Behavior normal.     No results found for any visits on 11/24/21.    The ASCVD Risk score (Arnett DK, et al., 2019) failed to calculate for the following reasons:   The 2019 ASCVD risk score is only valid for ages 33 to 23    Assessment & Plan:   Problem List Items Addressed This Visit   None Visit Diagnoses     Need for Tdap vaccination    -  Primary   Relevant Orders  Tdap vaccine greater than or equal to 7yo IM   Healthcare maintenance       Relevant Orders   CBC with Differential/Platelet   Comprehensive metabolic panel   Lipid panel   Urinalysis   Screening for diabetes mellitus       Relevant Orders   Hemoglobin A1c       Return in about 1 year (around 11/25/2022), or if symptoms worsen or fail to improve.  Encourage exercise for 30 minutes 5 days weekly.  Walking is fine.  Information was given on health maintenance and disease prevention.  Continue follow-up with GYN for female care.  Tdap today.  Libby Maw, MD

## 2021-11-25 LAB — COMPREHENSIVE METABOLIC PANEL
ALT: 15 U/L (ref 0–35)
AST: 19 U/L (ref 0–37)
Albumin: 4.2 g/dL (ref 3.5–5.2)
Alkaline Phosphatase: 49 U/L (ref 39–117)
BUN: 11 mg/dL (ref 6–23)
CO2: 27 mEq/L (ref 19–32)
Calcium: 9.6 mg/dL (ref 8.4–10.5)
Chloride: 103 mEq/L (ref 96–112)
Creatinine, Ser: 0.83 mg/dL (ref 0.40–1.20)
GFR: 89.22 mL/min (ref >60.00)
Glucose, Bld: 84 mg/dL (ref 70–99)
Potassium: 4.1 mEq/L (ref 3.5–5.1)
Sodium: 137 mEq/L (ref 135–145)
Total Bilirubin: 0.5 mg/dL (ref 0.2–1.2)
Total Protein: 7.6 g/dL (ref 6.0–8.3)

## 2021-11-25 LAB — CBC WITH DIFFERENTIAL/PLATELET
Basophils Absolute: 0 10*3/uL (ref 0.0–0.1)
Basophils Relative: 0.7 % (ref 0.0–3.0)
Eosinophils Absolute: 0.1 10*3/uL (ref 0.0–0.7)
Eosinophils Relative: 2.2 % (ref 0.0–5.0)
HCT: 37.4 % (ref 36.0–46.0)
Hemoglobin: 12.6 g/dL (ref 12.0–15.0)
Lymphocytes Relative: 30.1 % (ref 12.0–46.0)
Lymphs Abs: 1.5 10*3/uL (ref 0.7–4.0)
MCHC: 33.8 g/dL (ref 30.0–36.0)
MCV: 98 fl (ref 78.0–100.0)
Monocytes Absolute: 0.5 10*3/uL (ref 0.1–1.0)
Monocytes Relative: 9.9 % (ref 3.0–12.0)
Neutro Abs: 2.9 10*3/uL (ref 1.4–7.7)
Neutrophils Relative %: 57.1 % (ref 43.0–77.0)
Platelets: 283 10*3/uL (ref 150.0–400.0)
RBC: 3.81 Mil/uL — ABNORMAL LOW (ref 3.87–5.11)
RDW: 12.9 % (ref 11.5–15.5)
WBC: 5.1 10*3/uL (ref 4.0–10.5)

## 2021-11-25 LAB — URINALYSIS
Bilirubin Urine: NEGATIVE
Hgb urine dipstick: NEGATIVE
Ketones, ur: NEGATIVE
Leukocytes,Ua: NEGATIVE
Nitrite: NEGATIVE
Specific Gravity, Urine: 1.01 (ref 1.000–1.030)
Total Protein, Urine: NEGATIVE
Urine Glucose: NEGATIVE
Urobilinogen, UA: 0.2 (ref 0.0–1.0)
pH: 7 (ref 5.0–8.0)

## 2021-11-25 LAB — LIPID PANEL
Cholesterol: 213 mg/dL — ABNORMAL HIGH (ref 0–200)
HDL: 62.6 mg/dL (ref >39.00)
LDL Cholesterol: 138 mg/dL — ABNORMAL HIGH (ref 0–99)
NonHDL: 150.42
Total CHOL/HDL Ratio: 3
Triglycerides: 60 mg/dL (ref 0.0–149.0)
VLDL: 12 mg/dL (ref 0.0–40.0)

## 2021-11-25 LAB — HEMOGLOBIN A1C: Hgb A1c MFr Bld: 5.4 % (ref 4.6–6.5)

## 2023-02-05 ENCOUNTER — Other Ambulatory Visit: Payer: Self-pay | Admitting: Obstetrics and Gynecology

## 2023-02-05 DIAGNOSIS — Z0000 Encounter for general adult medical examination without abnormal findings: Secondary | ICD-10-CM

## 2023-02-23 ENCOUNTER — Ambulatory Visit
Admission: RE | Admit: 2023-02-23 | Discharge: 2023-02-23 | Disposition: A | Discharge status: Home / Self Care | Payer: No Typology Code available for payment source | Source: Ambulatory Visit | Attending: Obstetrics and Gynecology | Admitting: Obstetrics and Gynecology

## 2023-02-23 DIAGNOSIS — Z0000 Encounter for general adult medical examination without abnormal findings: Secondary | ICD-10-CM

## 2023-08-22 ENCOUNTER — Emergency Department (HOSPITAL_COMMUNITY)
Admission: EM | Admit: 2023-08-22 | Discharge: 2023-08-23 | Disposition: A | Discharge status: Home / Self Care | Attending: Emergency Medicine | Admitting: Emergency Medicine

## 2023-08-22 ENCOUNTER — Other Ambulatory Visit: Payer: Self-pay

## 2023-08-22 DIAGNOSIS — M545 Low back pain, unspecified: Secondary | ICD-10-CM | POA: Diagnosis present

## 2023-08-22 DIAGNOSIS — R109 Unspecified abdominal pain: Secondary | ICD-10-CM | POA: Insufficient documentation

## 2023-08-22 DIAGNOSIS — M549 Dorsalgia, unspecified: Secondary | ICD-10-CM

## 2023-08-22 DIAGNOSIS — R350 Frequency of micturition: Secondary | ICD-10-CM | POA: Insufficient documentation

## 2023-08-22 NOTE — ED Triage Notes (Signed)
 Pt reports "pain all over my body" since this morning. Pt reports BIL flank pain that radiates to mid thoracic pain. Pt also states that "I feel like I have to pee really bad, but when I go, not a lot comes out"

## 2023-08-23 ENCOUNTER — Emergency Department (HOSPITAL_COMMUNITY)

## 2023-08-23 ENCOUNTER — Ambulatory Visit (INDEPENDENT_AMBULATORY_CARE_PROVIDER_SITE_OTHER)
Admission: RE | Admit: 2023-08-23 | Discharge: 2023-08-23 | Disposition: A | Discharge status: Home / Self Care | Source: Ambulatory Visit | Attending: Family Medicine | Admitting: Family Medicine

## 2023-08-23 ENCOUNTER — Encounter: Payer: Self-pay | Admitting: Family Medicine

## 2023-08-23 ENCOUNTER — Ambulatory Visit: Payer: No Typology Code available for payment source | Admitting: Family Medicine

## 2023-08-23 VITALS — BP 106/78 | HR 103 | Temp 98.1°F | Ht 69.0 in | Wt 173.0 lb

## 2023-08-23 DIAGNOSIS — M545 Low back pain, unspecified: Secondary | ICD-10-CM

## 2023-08-23 DIAGNOSIS — F418 Other specified anxiety disorders: Secondary | ICD-10-CM | POA: Diagnosis not present

## 2023-08-23 LAB — CK: Total CK: 156 U/L (ref 38–234)

## 2023-08-23 LAB — URINALYSIS, ROUTINE W REFLEX MICROSCOPIC
Bacteria, UA: NONE SEEN
Bilirubin Urine: NEGATIVE
Glucose, UA: NEGATIVE mg/dL
Ketones, ur: NEGATIVE mg/dL
Leukocytes,Ua: NEGATIVE
Nitrite: NEGATIVE
Protein, ur: NEGATIVE mg/dL
Specific Gravity, Urine: 1.019 (ref 1.005–1.030)
pH: 5 (ref 5.0–8.0)

## 2023-08-23 LAB — CBC WITH DIFFERENTIAL/PLATELET
Abs Immature Granulocytes: 0.04 10*3/uL (ref 0.00–0.07)
Basophils Absolute: 0.1 10*3/uL (ref 0.0–0.1)
Basophils Relative: 1 %
Eosinophils Absolute: 0.1 10*3/uL (ref 0.0–0.5)
Eosinophils Relative: 1 %
HCT: 36.9 % (ref 36.0–46.0)
Hemoglobin: 12.3 g/dL (ref 12.0–15.0)
Immature Granulocytes: 0 %
Lymphocytes Relative: 12 %
Lymphs Abs: 1.1 10*3/uL (ref 0.7–4.0)
MCH: 33.2 pg (ref 26.0–34.0)
MCHC: 33.3 g/dL (ref 30.0–36.0)
MCV: 99.7 fL (ref 80.0–100.0)
Monocytes Absolute: 0.8 10*3/uL (ref 0.1–1.0)
Monocytes Relative: 9 %
Neutro Abs: 6.9 10*3/uL (ref 1.7–7.7)
Neutrophils Relative %: 77 %
Platelets: 253 10*3/uL (ref 150–400)
RBC: 3.7 MIL/uL — ABNORMAL LOW (ref 3.87–5.11)
RDW: 12.3 % (ref 11.5–15.5)
WBC: 9.1 10*3/uL (ref 4.0–10.5)
nRBC: 0 % (ref 0.0–0.2)

## 2023-08-23 LAB — PREGNANCY, URINE: Preg Test, Ur: NEGATIVE

## 2023-08-23 LAB — BASIC METABOLIC PANEL
Anion gap: 5 (ref 5–15)
BUN: 12 mg/dL (ref 6–20)
CO2: 24 mmol/L (ref 22–32)
Calcium: 9.1 mg/dL (ref 8.9–10.3)
Chloride: 107 mmol/L (ref 98–111)
Creatinine, Ser: 0.88 mg/dL (ref 0.44–1.00)
GFR, Estimated: >60 mL/min (ref >60)
Glucose, Bld: 109 mg/dL — ABNORMAL HIGH (ref 70–99)
Potassium: 4.4 mmol/L (ref 3.5–5.1)
Sodium: 136 mmol/L (ref 135–145)

## 2023-08-23 LAB — HEPATIC FUNCTION PANEL
ALT: 17 U/L (ref 0–44)
AST: 18 U/L (ref 15–41)
Albumin: 3.4 g/dL — ABNORMAL LOW (ref 3.5–5.0)
Alkaline Phosphatase: 51 U/L (ref 38–126)
Bilirubin, Direct: 0.1 mg/dL (ref 0.0–0.2)
Indirect Bilirubin: 0.6 mg/dL (ref 0.3–0.9)
Total Bilirubin: 0.7 mg/dL (ref 0.0–1.2)
Total Protein: 6.9 g/dL (ref 6.5–8.1)

## 2023-08-23 LAB — LIPASE, BLOOD: Lipase: 28 U/L (ref 11–51)

## 2023-08-23 LAB — RESP PANEL BY RT-PCR (RSV, FLU A&B, COVID)  RVPGX2
Influenza A by PCR: NEGATIVE
Influenza B by PCR: NEGATIVE
Resp Syncytial Virus by PCR: NEGATIVE
SARS Coronavirus 2 by RT PCR: NEGATIVE

## 2023-08-23 LAB — D-DIMER, QUANTITATIVE: D-Dimer, Quant: 0.56 ug{FEU}/mL — ABNORMAL HIGH (ref 0.00–0.50)

## 2023-08-23 MED ORDER — IOHEXOL 350 MG/ML SOLN
75.0000 mL | Freq: Once | INTRAVENOUS | Status: AC | PRN
  Administered 2023-08-23: 75 mL via INTRAVENOUS

## 2023-08-23 MED ORDER — NAPROXEN 500 MG PO TABS: 500.0000 mg | ORAL_TABLET | Freq: Two times a day (BID) | ORAL | 0 refills | Status: AC

## 2023-08-23 MED ORDER — KETOROLAC TROMETHAMINE 15 MG/ML IJ SOLN
15.0000 mg | Freq: Once | INTRAMUSCULAR | Status: AC
  Administered 2023-08-23: 15 mg via INTRAVENOUS
  Filled 2023-08-23: qty 1

## 2023-08-23 MED ORDER — DULOXETINE HCL 20 MG PO CPEP: 20.0000 mg | ORAL_CAPSULE | Freq: Every day | ORAL | 1 refills | Status: DC

## 2023-08-23 MED ORDER — DULOXETINE HCL 20 MG PO CPEP
20.0000 mg | ORAL_CAPSULE | Freq: Every day | ORAL | 1 refills | Status: AC
Start: 2023-08-23 — End: ?

## 2023-08-23 MED ORDER — METHOCARBAMOL 500 MG PO TABS: 500.0000 mg | ORAL_TABLET | Freq: Two times a day (BID) | ORAL | 0 refills | Status: AC | PRN

## 2023-08-23 NOTE — ED Notes (Signed)
 Patient transported to CT

## 2023-08-23 NOTE — Progress Notes (Unsigned)
 Established Patient Office Visit   Subjective:  Patient ID: [name], female    DOB: 1980-10-04  Age: 43 y.o. MRN: [id number]  Chief Complaint  Patient presents with   Transitions Of Care    Back pain x 1 week    HPI Encounter Diagnoses  Name Primary?   Depression with anxiety Yes   Midline low back pain without sciatica, unspecified chronicity    Midline lower back pain over the last for 5 days.  No injury history but she did have a 4-hour drive to take her daughter to a competition.  Was seen in the emergency room and worked up for pulmonary embolism.  UA CBC, hepatic function, and BMP were all essentially normal.  D-dimer slightly elevated.  Back pain is nonradiating.  There is no numbness or tingling in her lower extremities.  Denies weakness.  No bowel or bladder incontinence.  She admits to depression with anxiety over the last several months.  Distant history of depression that was treated. {History (Optional):23778}  Review of Systems  Constitutional: Negative.   HENT: Negative.    Eyes:  Negative for blurred vision, discharge and redness.  Respiratory: Negative.    Cardiovascular: Negative.   Gastrointestinal:  Negative for abdominal pain.  Genitourinary: Negative.   Musculoskeletal:  Positive for back pain. Negative for myalgias.  Skin:  Negative for rash.  Neurological:  Negative for tingling, loss of consciousness and weakness.  Endo/Heme/Allergies:  Negative for polydipsia.  Psychiatric/Behavioral:  Positive for depression. Negative for suicidal ideas. The patient is nervous/anxious.       08/23/2023    2:21 PM 11/24/2021    1:57 PM 07/11/2019   11:25 AM  Depression screen PHQ 2/9  Decreased Interest 2 0 0  Down, Depressed, Hopeless 1 0 0  PHQ - 2 Score 3 0 0  Altered sleeping 2    Tired, decreased energy 2    Change in appetite 1    Feeling bad or failure about yourself  0    Trouble concentrating 0    Moving slowly or fidgety/restless 0    Suicidal  thoughts 0    PHQ-9 Score 8    Difficult doing work/chores Not difficult at all        Current Outpatient Medications:    methocarbamol (ROBAXIN) 500 MG tablet, Take 1 tablet (500 mg total) by mouth every 12 (twelve) hours as needed for muscle spasms., Disp: 12 tablet, Rfl: 0   naproxen (NAPROSYN) 500 MG tablet, Take 1 tablet (500 mg total) by mouth 2 (two) times daily., Disp: 30 tablet, Rfl: 0   simethicone (MYLICON) 80 MG chewable tablet, Chew 80 mg by mouth every 6 (six) hours as needed for flatulence., Disp: , Rfl:    DULoxetine (CYMBALTA) 20 MG capsule, Take 1 capsule (20 mg total) by mouth daily., Disp: 30 capsule, Rfl: 1   Objective:     BP 106/78   Pulse (!) 103   Temp 98.1 F (36.7 C)   Ht 5\' 9"  (1.753 m)   Wt 173 lb (78.5 kg)   LMP 03/05/2013   SpO2 97%   BMI 25.55 kg/m  {Vitals History (Optional):23777}  Physical Exam Constitutional:      General: She is not in acute distress.    Appearance: Normal appearance. She is not ill-appearing, toxic-appearing or diaphoretic.  HENT:     Head: Normocephalic and atraumatic.     Right Ear: External ear normal.     Left Ear: External ear  normal.  Eyes:     General: No scleral icterus.       Right eye: No discharge.        Left eye: No discharge.     Extraocular Movements: Extraocular movements intact.     Conjunctiva/sclera: Conjunctivae normal.  Pulmonary:     Effort: Pulmonary effort is normal. No respiratory distress.  Musculoskeletal:     Thoracic back: Tenderness and bony tenderness present. Normal range of motion.     Lumbar back: Bony tenderness present. Decreased range of motion (Flexion to 6 inches.). Negative right straight leg raise test and negative left straight leg raise test.  Skin:    General: Skin is warm and dry.  Neurological:     Mental Status: She is alert and oriented to person, place, and time.     Motor: No weakness.     Deep Tendon Reflexes:     Reflex Scores:      Patellar reflexes are 2+  on the right side and 2+ on the left side.      Achilles reflexes are 1+ on the right side and 1+ on the left side. Psychiatric:        Mood and Affect: Mood normal.        Behavior: Behavior normal.      Results for orders placed or performed during the hospital encounter of 08/22/23  Resp panel by RT-PCR (RSV, Flu A&B, Covid) Anterior Nasal Swab   Specimen: Anterior Nasal Swab  Result Value Ref Range   SARS Coronavirus 2 by RT PCR NEGATIVE NEGATIVE   Influenza A by PCR NEGATIVE NEGATIVE   Influenza B by PCR NEGATIVE NEGATIVE   Resp Syncytial Virus by PCR NEGATIVE NEGATIVE  Urinalysis, Routine w reflex microscopic -Urine, Clean Catch  Result Value Ref Range   Color, Urine YELLOW YELLOW   APPearance CLEAR CLEAR   Specific Gravity, Urine 1.019 1.005 - 1.030   pH 5.0 5.0 - 8.0   Glucose, UA NEGATIVE NEGATIVE mg/dL   Hgb urine dipstick SMALL (A) NEGATIVE   Bilirubin Urine NEGATIVE NEGATIVE   Ketones, ur NEGATIVE NEGATIVE mg/dL   Protein, ur NEGATIVE NEGATIVE mg/dL   Nitrite NEGATIVE NEGATIVE   Leukocytes,Ua NEGATIVE NEGATIVE   RBC / HPF 0-5 0 - 5 RBC/hpf   WBC, UA 0-5 0 - 5 WBC/hpf   Bacteria, UA NONE SEEN NONE SEEN   Squamous Epithelial / HPF 0-5 0 - 5 /HPF   Mucus PRESENT   Pregnancy, urine  Result Value Ref Range   Preg Test, Ur NEGATIVE NEGATIVE  CBC with Differential  Result Value Ref Range   WBC 9.1 4.0 - 10.5 K/uL   RBC 3.70 (L) 3.87 - 5.11 MIL/uL   Hemoglobin 12.3 12.0 - 15.0 g/dL   HCT 16.1 09.6 - 04.5 %   MCV 99.7 80.0 - 100.0 fL   MCH 33.2 26.0 - 34.0 pg   MCHC 33.3 30.0 - 36.0 g/dL   RDW 40.9 81.1 - 91.4 %   Platelets 253 150 - 400 K/uL   nRBC 0.0 0.0 - 0.2 %   Neutrophils Relative % 77 %   Neutro Abs 6.9 1.7 - 7.7 K/uL   Lymphocytes Relative 12 %   Lymphs Abs 1.1 0.7 - 4.0 K/uL   Monocytes Relative 9 %   Monocytes Absolute 0.8 0.1 - 1.0 K/uL   Eosinophils Relative 1 %   Eosinophils Absolute 0.1 0.0 - 0.5 K/uL   Basophils Relative 1 %   Basophils  Absolute 0.1 0.0 - 0.1 K/uL   Immature Granulocytes 0 %   Abs Immature Granulocytes 0.04 0.00 - 0.07 K/uL  Basic metabolic panel  Result Value Ref Range   Sodium 136 135 - 145 mmol/L   Potassium 4.4 3.5 - 5.1 mmol/L   Chloride 107 98 - 111 mmol/L   CO2 24 22 - 32 mmol/L   Glucose, Bld 109 (H) 70 - 99 mg/dL   BUN 12 6 - 20 mg/dL   Creatinine, Ser 7.82 0.44 - 1.00 mg/dL   Calcium 9.1 8.9 - 95.6 mg/dL   GFR, Estimated >21 >30 mL/min   Anion gap 5 5 - 15  CK  Result Value Ref Range   Total CK 156 38 - 234 U/L  Hepatic function panel  Result Value Ref Range   Total Protein 6.9 6.5 - 8.1 g/dL   Albumin 3.4 (L) 3.5 - 5.0 g/dL   AST 18 15 - 41 U/L   ALT 17 0 - 44 U/L   Alkaline Phosphatase 51 38 - 126 U/L   Total Bilirubin 0.7 0.0 - 1.2 mg/dL   Bilirubin, Direct 0.1 0.0 - 0.2 mg/dL   Indirect Bilirubin 0.6 0.3 - 0.9 mg/dL  Lipase, blood  Result Value Ref Range   Lipase 28 11 - 51 U/L  D-dimer, quantitative  Result Value Ref Range   D-Dimer, Quant 0.56 (H) 0.00 - 0.50 ug/mL-FEU    {Labs (Optional):23779}  The 10-year ASCVD risk score (Arnett DK, et al., 2019) is: 0.2%    Assessment & Plan:   Depression with anxiety -     DULoxetine HCl; Take 1 capsule (20 mg total) by mouth daily.  Dispense: 30 capsule; Refill: 1  Midline low back pain without sciatica, unspecified chronicity -     DG Lumbar Spine Complete; Future -     DG Thoracic Spine W/Swimmers; Future    Return in about 5 weeks (around 09/27/2023).  Fill prescription from emergency room today for Robaxin and Naprosyn.  Go ahead and start Cymbalta.  Information was given on depression and anxiety as well as duloxetine.   Mliss Sax, MD

## 2023-08-23 NOTE — Discharge Instructions (Addendum)
Alternate ice and heat to areas of injury 3-4 times per day to limit inflammation and spasm.  Avoid strenuous activity and heavy lifting.  We recommend consistent use of naproxen in addition to Robaxin for muscle spasms. Do not drive or drink alcohol after taking Robaxin as it may make you drowsy and impair your judgment.  We recommend follow-up with a primary care doctor to ensure resolution of symptoms.  Return to the ED for any new or concerning symptoms. 

## 2023-08-23 NOTE — Transitions of Care (Post Inpatient/ED Visit) (Unsigned)
   08/23/2023  Name: [name] MRN: [id number] DOB: 1980/08/11  {AMBTOCFU:29073}

## 2023-08-23 NOTE — ED Provider Notes (Signed)
  EMERGENCY DEPARTMENT AT [facility] Provider Note   CSN: [id number] Arrival date & time: 08/22/23  2320     History  Chief Complaint  Patient presents with   Flank Pain    [name] is a 43 y.o. female.  43 year old female with a history of pneumothorax, esophageal reflux, migraines, anxiety presents to the emergency department for evaluation of flank pain.  She reports that she initially had some vague right-sided flank pain a few days ago.  She awoke this morning with worsening of this flank pain which migrated to "pain all over my body".  She feels that the pain is worse in her low back.  It is aggravated with deep breathing.  She has had some urinary urgency and frequency with decreased urinary stream, but no preceding dysuria.  No fevers, vomiting, bowel changes.  She has tried Gas-X for symptoms without relief.  Abdominal surgical history significant for tubal ligation, vaginal hysterectomy with BSO.  The history is provided by the patient. No language interpreter was used.  Flank Pain       Home Medications Prior to Admission medications   Medication Sig Start Date End Date Taking? Authorizing Provider  methocarbamol (ROBAXIN) 500 MG tablet Take 1 tablet (500 mg total) by mouth every 12 (twelve) hours as needed for muscle spasms. 08/23/23  Yes Antony Madura, PA-C  naproxen (NAPROSYN) 500 MG tablet Take 1 tablet (500 mg total) by mouth 2 (two) times daily. 08/23/23  Yes Antony Madura, PA-C  simethicone (MYLICON) 80 MG chewable tablet Chew 80 mg by mouth every 6 (six) hours as needed for flatulence.   Yes [provider]  gabapentin (NEURONTIN) 300 MG capsule Take 1 capsule (300 mg total) by mouth at bedtime. For nerve pain 07/11/19 05/22/20  Rodolph Bong, MD  iron polysaccharides (NIFEREX) 150 MG capsule Take 1 capsule (150 mg total) by mouth daily. For one month then stop. 08/26/11 09/16/11  Ardelle Balls, PA-C      Allergies     Penicillins    Review of Systems   Review of Systems  Genitourinary:  Positive for flank pain.  Ten systems reviewed and are negative for acute change, except as noted in the HPI.    Physical Exam Updated Vital Signs BP 102/70   Pulse 66   Temp 98.6 F (37 C) (Oral)   Resp 16   Ht 5\' 9"  (1.753 m)   Wt 78.9 kg   LMP 03/05/2013   SpO2 98%   BMI 25.70 kg/m   Physical Exam Vitals and nursing note reviewed.  Constitutional:      General: She is not in acute distress.    Appearance: She is well-developed. She is not diaphoretic.     Comments: Appears uncomfortable, but nontoxic  HENT:     Head: Normocephalic and atraumatic.  Eyes:     General: No scleral icterus.    Conjunctiva/sclera: Conjunctivae normal.  Cardiovascular:     Rate and Rhythm: Normal rate and regular rhythm.     Pulses: Normal pulses.  Pulmonary:     Effort: Pulmonary effort is normal. No respiratory distress.     Breath sounds: No stridor.     Comments: Respirations even and unlabored Abdominal:     General: There is no distension.  Musculoskeletal:        General: Tenderness present. Normal range of motion.     Cervical back: Normal range of motion.     Comments: Tenderness of  the bilateral upper lumbar paraspinal muscles without spasm.  No bony deformities, step-offs, crepitus to the lumbosacral midline.  Skin:    General: Skin is warm and dry.     Coloration: Skin is not pale.     Findings: No erythema or rash.  Neurological:     Mental Status: She is alert and oriented to person, place, and time.     Coordination: Coordination normal.  Psychiatric:        Behavior: Behavior normal.     ED Results / Procedures / Treatments   Labs (all labs ordered are listed, but only abnormal results are displayed) Labs Reviewed  URINALYSIS, ROUTINE W REFLEX MICROSCOPIC - Abnormal; Notable for the following components:      Result Value   Hgb urine dipstick SMALL (*)    All other components within  normal limits  CBC WITH DIFFERENTIAL/PLATELET - Abnormal; Notable for the following components:   RBC 3.70 (*)    All other components within normal limits  BASIC METABOLIC PANEL - Abnormal; Notable for the following components:   Glucose, Bld 109 (*)    All other components within normal limits  HEPATIC FUNCTION PANEL - Abnormal; Notable for the following components:   Albumin 3.4 (*)    All other components within normal limits  D-DIMER, QUANTITATIVE - Abnormal; Notable for the following components:   D-Dimer, Quant 0.56 (*)    All other components within normal limits  RESP PANEL BY RT-PCR (RSV, FLU A&B, COVID)  RVPGX2  PREGNANCY, URINE  CK  LIPASE, BLOOD    EKG None  Radiology CT Angio Chest PE W and/or Wo Contrast Result Date: 08/23/2023 CLINICAL DATA:  Pulmonary embolism suspected, positive D-dimer. EXAM: CT ANGIOGRAPHY CHEST WITH CONTRAST TECHNIQUE: Multidetector CT imaging of the chest was performed using the standard protocol during bolus administration of intravenous contrast. Multiplanar CT image reconstructions and MIPs were obtained to evaluate the vascular anatomy. RADIATION DOSE REDUCTION: This exam was performed according to the departmental dose-optimization program which includes automated exposure control, adjustment of the mA and/or kV according to patient size and/or use of iterative reconstruction technique. CONTRAST:  75mL OMNIPAQUE IOHEXOL 350 MG/ML SOLN COMPARISON:  None Available. FINDINGS: Cardiovascular: Satisfactory opacification of the pulmonary arteries to the segmental level. No evidence of pulmonary embolism. Normal heart size. No pericardial effusion. Mediastinum/Nodes: No mass, adenopathy, or esophageal thickening Lungs/Pleura: Postoperative scarring at the left apex, reported bleb resection. There is no edema, consolidation, effusion, or pneumothorax. Upper Abdomen: No acute finding Musculoskeletal: No acute finding Review of the MIP images confirms the  above findings. IMPRESSION: Negative for pulmonary embolism or other acute finding. Electronically Signed   By: Tiburcio Pea M.D.   On: 08/23/2023 05:18   DG Chest 2 View Result Date: 08/23/2023 CLINICAL DATA:  Chest pain EXAM: CHEST - 2 VIEW COMPARISON:  11/20/2012 FINDINGS: The heart size and mediastinal contours are within normal limits. Both lungs are clear. The visualized skeletal structures are unremarkable. IMPRESSION: No active cardiopulmonary disease. Electronically Signed   By: Alcide Clever M.D.   On: 08/23/2023 03:07    Procedures Procedures    Medications Ordered in ED Medications  ketorolac (TORADOL) 15 MG/ML injection 15 mg (15 mg Intravenous Given 08/23/23 0129)  iohexol (OMNIPAQUE) 350 MG/ML injection 75 mL (75 mLs Intravenous Contrast Given 08/23/23 0427)    ED Course/ Medical Decision Making/ A&P Clinical Course as of 08/23/23 0603  Mon Aug 23, 2023  0226 Urinalysis does not suggest urinary tract infection,  therefore excluding pyelonephritis.  Urine pregnancy is negative.  Workup broadened. Chest x-ray added. [KH]  0244 Patient reports some improvement in her pain with medications.  Explained reasons for extending workup.  Patient verbalizes understanding.  If remainder of labs are reassuring, chest x-ray negative, unable to exclude musculoskeletal etiology.  Pain was notably reproducible on palpation during exam. [KH]    Clinical Course User Index [KH] Antony Madura, PA-C                                 Medical Decision Making Amount and/or Complexity of Data Reviewed Labs: ordered. Radiology: ordered.  Risk Prescription drug management.   This patient presents to the ED for concern of flank pain and pleuritic pain, this involves an extensive number of treatment options, and is a complaint that carries with it a high risk of complications and morbidity.  The differential diagnosis includes PE vs PTX vs PNA vs pyelonephritis vs kidney stone vs ectopic  pregnancy   Co morbidities that complicate the patient evaluation  PTX GERD Migraines   Additional history obtained:  Additional history obtained from partner, at bedside External records from outside source obtained and reviewed including baseline Hgb ~12, per chart review   Lab Tests:  I Ordered, and personally interpreted labs.  The pertinent results include:  D dimer 0.56. Labs otherwise WNL   Imaging Studies ordered:  I ordered imaging studies including CXR and CTA chest  I independently visualized and interpreted imaging which showed no acute cardiopulmonary abnormality I agree with the radiologist interpretation   Cardiac Monitoring:  The patient was maintained on a cardiac monitor.  I personally viewed and interpreted the cardiac monitored which showed an underlying rhythm of: NSR   Medicines ordered and prescription drug management:  I ordered medication including Toradol for pain  Reevaluation of the patient after these medicines showed that the patient improved I have reviewed the patients home medicines and have made adjustments as needed   Test Considered:  CT abd/pelvis - felt low yield given reassuring labs. Also no fever or leukocytosis to suggest underlying infectious cause. No SIRS criteria or sepsis concerns.   Problem List / ED Course:  As above Work up has excluded PE, PTX, PNA, acute cholecystitis, choledocholithiasis, kidney stone, pyelonephritis, ectopic pregnancy, rhabdomyolysis, hepatitis. Given reproducibility of pain on exam, suspect musculoskeletal etiology.  Will discharge with course of NSAIDs and muscle relaxers.   Reevaluation:  After the interventions noted above, I reevaluated the patient and found that they have :stayed the same   Social Determinants of Health:  Good social support   Dispostion:  After consideration of the diagnostic results and the patients response to treatment, I feel that the patent would benefit  from outpatient PCP f/u for recheck. Return precautions discussed and provided. Patient discharged in stable condition with no unaddressed concerns.          Final Clinical Impression(s) / ED Diagnoses Final diagnoses:  Musculoskeletal back pain    Rx / DC Orders ED Discharge Orders          Ordered    naproxen (NAPROSYN) 500 MG tablet  2 times daily        08/23/23 0551    methocarbamol (ROBAXIN) 500 MG tablet  Every 12 hours PRN        08/23/23 0551              Antony Madura, PA-C  08/23/23 1324    Melene Plan, DO 08/23/23 [phone]

## 2023-08-24 ENCOUNTER — Encounter: Payer: Self-pay | Admitting: Family Medicine

## 2023-08-24 NOTE — Transitions of Care (Post Inpatient/ED Visit) (Signed)
   08/24/2023  Name: [name] MRN: [id number] DOB: Sep 01, 1980  Today's TOC FU Call Status: Today's TOC FU Call Status:: Unsuccessful Call (1st Attempt) Patient's Name and Date of Birth confirmed.  Transition Care Management Follow-up Telephone Call Discharge Facility: [facility]) Type of Discharge: Emergency Department How have you been since you were released from the hospital?: Same Any questions or concerns?: Yes Patient Questions/Concerns:: negative findings but pain still present Patient Questions/Concerns Addressed: Provided Patient Educational Materials  Items Reviewed: Did you receive and understand the discharge instructions provided?: Yes Medications obtained,verified, and reconciled?: Yes (Medications Reviewed) Any new allergies since your discharge?: No Dietary orders reviewed?: No Do you have support at home?: Yes  Medications Reviewed Today: Medications Reviewed Today     Reviewed by Mliss Sax, MD (Physician) on 08/24/23 at 1143  Med List Status: <None>   Medication Order Taking? Sig Documenting Provider Last Dose Status Informant  DULoxetine (CYMBALTA) 20 MG capsule [id number]  Take 1 capsule (20 mg total) by mouth daily. Mliss Sax, MD  Active     Discontinued 05/22/20 1443     Discontinued 09/16/11 1442 (Error)   methocarbamol (ROBAXIN) 500 MG tablet [id number] Yes Take 1 tablet (500 mg total) by mouth every 12 (twelve) hours as needed for muscle spasms. Antony Madura, PA-C Taking Active            Med Note Mliss Sax, Merrilee Seashore   Mon Aug 23, 2023  1:57 PM) Have not started  naproxen (NAPROSYN) 500 MG tablet [id number] Yes Take 1 tablet (500 mg total) by mouth 2 (two) times daily. Antony Madura, PA-C Taking Active   simethicone (MYLICON) 80 MG chewable tablet [id number] Yes Chew 80 mg by mouth every 6 (six) hours as needed for flatulence. [provider] Taking Active Self            Home Care and Equipment/Supplies: Were  Home Health Services Ordered?: No Any new equipment or medical supplies ordered?: No  Functional Questionnaire: Do you need assistance with bathing/showering or dressing?: No Do you need assistance with meal preparation?: No Do you need assistance with eating?: No Do you have difficulty maintaining continence: No Do you need assistance with getting out of bed/getting out of a chair/moving?: No Do you have difficulty managing or taking your medications?: No  Follow up appointments reviewed: PCP Follow-up appointment confirmed?: Yes Date of PCP follow-up appointment?: 08/23/23 Follow-up Provider: Dr. Doreene Burke Specialist [facility] Follow-up appointment confirmed?: No Reason Specialist Follow-Up Not Confirmed: Appointment Sceduled by [facility] Calling Clinician Do you need transportation to your follow-up appointment?: No Do you understand care options if your condition(s) worsen?: Yes-patient verbalized understanding    SIGNATURE Elam Dutch, CMA

## 2023-09-10 ENCOUNTER — Encounter: Payer: Self-pay | Admitting: Family Medicine

## 2024-03-14 ENCOUNTER — Encounter: Payer: Self-pay | Admitting: Family Medicine

## 2024-03-14 LAB — HM PAP SMEAR

## 2024-04-18 ENCOUNTER — Other Ambulatory Visit: Payer: Self-pay | Admitting: Obstetrics and Gynecology

## 2024-04-18 DIAGNOSIS — Z1231 Encounter for screening mammogram for malignant neoplasm of breast: Secondary | ICD-10-CM

## 2024-05-09 ENCOUNTER — Ambulatory Visit
Admission: RE | Admit: 2024-05-09 | Discharge: 2024-05-09 | Disposition: A | Discharge status: Home / Self Care | Source: Ambulatory Visit | Attending: Obstetrics and Gynecology | Admitting: Obstetrics and Gynecology

## 2024-05-09 DIAGNOSIS — Z1231 Encounter for screening mammogram for malignant neoplasm of breast: Secondary | ICD-10-CM
